# Patient Record
Sex: Female | Born: 1990 | Race: White | Hispanic: No | Marital: Single | Smoking: Current every day smoker
Health system: Southern US, Community
[De-identification: ages and names within clinical notes are randomized; demographics above are authoritative.]

## PROBLEM LIST (undated history)

## (undated) DIAGNOSIS — K219 Gastro-esophageal reflux disease without esophagitis: Secondary | ICD-10-CM

## (undated) DIAGNOSIS — F419 Anxiety disorder, unspecified: Secondary | ICD-10-CM

## (undated) DIAGNOSIS — E282 Polycystic ovarian syndrome: Secondary | ICD-10-CM

## (undated) DIAGNOSIS — D649 Anemia, unspecified: Secondary | ICD-10-CM

## (undated) DIAGNOSIS — E041 Nontoxic single thyroid nodule: Secondary | ICD-10-CM

## (undated) DIAGNOSIS — M2550 Pain in unspecified joint: Secondary | ICD-10-CM

## (undated) DIAGNOSIS — F32A Depression, unspecified: Secondary | ICD-10-CM

## (undated) DIAGNOSIS — K5900 Constipation, unspecified: Secondary | ICD-10-CM

## (undated) DIAGNOSIS — E739 Lactose intolerance, unspecified: Secondary | ICD-10-CM

## (undated) DIAGNOSIS — K589 Irritable bowel syndrome without diarrhea: Secondary | ICD-10-CM

## (undated) DIAGNOSIS — M549 Dorsalgia, unspecified: Secondary | ICD-10-CM

## (undated) HISTORY — DX: Anemia, unspecified: D64.9

## (undated) HISTORY — DX: Nontoxic single thyroid nodule: E04.1

## (undated) HISTORY — DX: Constipation, unspecified: K59.00

## (undated) HISTORY — DX: Lactose intolerance, unspecified: E73.9

## (undated) HISTORY — DX: Dorsalgia, unspecified: M54.9

## (undated) HISTORY — DX: Irritable bowel syndrome, unspecified: K58.9

## (undated) HISTORY — DX: Gastro-esophageal reflux disease without esophagitis: K21.9

## (undated) HISTORY — DX: Depression, unspecified: F32.A

## (undated) HISTORY — DX: Anxiety disorder, unspecified: F41.9

## (undated) HISTORY — DX: Pain in unspecified joint: M25.50

---
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
## 2008-06-01 ENCOUNTER — Emergency Department (HOSPITAL_BASED_OUTPATIENT_CLINIC_OR_DEPARTMENT_OTHER): Admission: EM | Admit: 2008-06-01 | Discharge: 2008-06-01 | Payer: Self-pay | Admitting: Emergency Medicine

## 2008-07-02 HISTORY — PX: TONSILLECTOMY AND ADENOIDECTOMY: SUR1326

## 2008-12-04 ENCOUNTER — Emergency Department (HOSPITAL_BASED_OUTPATIENT_CLINIC_OR_DEPARTMENT_OTHER): Admission: EM | Admit: 2008-12-04 | Discharge: 2008-12-04 | Payer: Self-pay | Admitting: Emergency Medicine

## 2008-12-04 ENCOUNTER — Ambulatory Visit: Payer: Self-pay | Admitting: Diagnostic Radiology

## 2009-04-18 ENCOUNTER — Emergency Department (HOSPITAL_COMMUNITY): Admission: EM | Admit: 2009-04-18 | Discharge: 2009-04-19 | Payer: Self-pay | Admitting: Emergency Medicine

## 2009-12-09 ENCOUNTER — Emergency Department (HOSPITAL_BASED_OUTPATIENT_CLINIC_OR_DEPARTMENT_OTHER): Admission: EM | Admit: 2009-12-09 | Discharge: 2009-12-09 | Payer: Self-pay | Admitting: Emergency Medicine

## 2010-10-09 LAB — RAPID STREP SCREEN (MED CTR MEBANE ONLY): Streptococcus, Group A Screen (Direct): NEGATIVE

## 2011-07-23 ENCOUNTER — Emergency Department (INDEPENDENT_AMBULATORY_CARE_PROVIDER_SITE_OTHER): Payer: 59

## 2011-07-23 ENCOUNTER — Encounter (HOSPITAL_BASED_OUTPATIENT_CLINIC_OR_DEPARTMENT_OTHER): Payer: Self-pay | Admitting: Family Medicine

## 2011-07-23 ENCOUNTER — Emergency Department (HOSPITAL_BASED_OUTPATIENT_CLINIC_OR_DEPARTMENT_OTHER)
Admission: EM | Admit: 2011-07-23 | Discharge: 2011-07-23 | Disposition: A | Discharge status: Home / Self Care | Payer: 59 | Attending: Emergency Medicine | Admitting: Emergency Medicine

## 2011-07-23 DIAGNOSIS — IMO0002 Reserved for concepts with insufficient information to code with codable children: Secondary | ICD-10-CM | POA: Insufficient documentation

## 2011-07-23 DIAGNOSIS — S161XXA Strain of muscle, fascia and tendon at neck level, initial encounter: Secondary | ICD-10-CM

## 2011-07-23 DIAGNOSIS — S0990XA Unspecified injury of head, initial encounter: Secondary | ICD-10-CM

## 2011-07-23 DIAGNOSIS — S0993XA Unspecified injury of face, initial encounter: Secondary | ICD-10-CM

## 2011-07-23 DIAGNOSIS — G43909 Migraine, unspecified, not intractable, without status migrainosus: Secondary | ICD-10-CM | POA: Insufficient documentation

## 2011-07-23 DIAGNOSIS — E282 Polycystic ovarian syndrome: Secondary | ICD-10-CM | POA: Insufficient documentation

## 2011-07-23 DIAGNOSIS — M542 Cervicalgia: Secondary | ICD-10-CM

## 2011-07-23 DIAGNOSIS — R51 Headache: Secondary | ICD-10-CM

## 2011-07-23 DIAGNOSIS — F0781 Postconcussional syndrome: Secondary | ICD-10-CM | POA: Insufficient documentation

## 2011-07-23 DIAGNOSIS — S199XXA Unspecified injury of neck, initial encounter: Secondary | ICD-10-CM

## 2011-07-23 DIAGNOSIS — IMO0001 Reserved for inherently not codable concepts without codable children: Secondary | ICD-10-CM | POA: Insufficient documentation

## 2011-07-23 DIAGNOSIS — G44309 Post-traumatic headache, unspecified, not intractable: Secondary | ICD-10-CM | POA: Insufficient documentation

## 2011-07-23 DIAGNOSIS — S139XXA Sprain of joints and ligaments of unspecified parts of neck, initial encounter: Secondary | ICD-10-CM | POA: Insufficient documentation

## 2011-07-23 HISTORY — DX: Polycystic ovarian syndrome: E28.2

## 2011-07-23 MED ORDER — KETOROLAC TROMETHAMINE 60 MG/2ML IM SOLN
60.0000 mg | Freq: Once | INTRAMUSCULAR | Status: AC
  Administered 2011-07-23: 60 mg via INTRAMUSCULAR
  Filled 2011-07-23: qty 2

## 2011-07-23 MED ORDER — HYDROCODONE-ACETAMINOPHEN 5-325 MG PO TABS
1.0000 | ORAL_TABLET | Freq: Once | ORAL | Status: DC
  Filled 2011-07-23: qty 1

## 2011-07-23 MED ORDER — METOCLOPRAMIDE HCL 5 MG/ML IJ SOLN
10.0000 mg | Freq: Once | INTRAMUSCULAR | Status: AC
  Administered 2011-07-23: 10 mg via INTRAMUSCULAR
  Filled 2011-07-23: qty 2

## 2011-07-23 MED ORDER — DIPHENHYDRAMINE HCL 50 MG/ML IJ SOLN
25.0000 mg | Freq: Once | INTRAMUSCULAR | Status: AC
  Administered 2011-07-23: 50 mg via INTRAMUSCULAR
  Filled 2011-07-23: qty 1

## 2011-07-23 MED ORDER — ONDANSETRON 4 MG PO TBDP
4.0000 mg | ORAL_TABLET | Freq: Once | ORAL | Status: AC
  Administered 2011-07-23: 4 mg via ORAL
  Filled 2011-07-23: qty 1

## 2011-07-23 MED ORDER — HYDROCODONE-ACETAMINOPHEN 5-500 MG PO TABS: 1.0000 | ORAL_TABLET | Freq: Four times a day (QID) | ORAL | Status: AC | PRN

## 2011-07-23 MED ORDER — ONDANSETRON 4 MG PO TBDP: 4.0000 mg | ORAL_TABLET | Freq: Three times a day (TID) | ORAL | Status: AC | PRN

## 2011-07-23 NOTE — ED Provider Notes (Signed)
History     CSN: [id number]  Arrival date & time 07/23/11  1839   First MD Initiated Contact with Patient 07/23/11 1903      Chief Complaint  Patient presents with  . Head Injury    (Consider location/radiation/quality/duration/timing/severity/associated sxs/prior treatment) HPI Comments: Pt states that she is having some blurry vision and is feeling tired:pt has a history of concussion about 8 months ago from previous fall off a horse:pt states that she didn't have a loc, but she did have a crack to the front of the helmet  Patient is a 21 y.o. female presenting with head injury. The history is provided by the patient. No language interpreter was used.  Head Injury  The incident occurred 3 to 5 hours ago. She came to the ER via walk-in. The injury mechanism was a direct blow. There was no loss of consciousness. There was no blood loss. The pain is moderate. The pain has been constant since the injury.    Past Medical History  Diagnosis Date  . Polycystic ovary disease   . Migraine     Past Surgical History  Procedure Date  . Tonsillectomy     No family history on file.  History  Substance Use Topics  . Smoking status: Never Smoker   . Smokeless tobacco: Not on file  . Alcohol Use: No    OB History    Grav Para Term Preterm Abortions TAB SAB Ect Mult Living                  Review of Systems  All other systems reviewed and are negative.    Allergies  Hepatitis b virus vaccine and Panmist dm  Home Medications   Current Outpatient Rx  Name Route Sig Dispense Refill  . NORGESTREL-ETHINYL ESTRADIOL 0.3-30 MG-MCG PO TABS Oral Take 1 tablet by mouth daily.      BP 148/92  Pulse 93  Temp(Src) 98.4 F (36.9 C) (Oral)  Resp 18  Ht 5\' 2"  (1.575 m)  Wt 160 lb (72.576 kg)  BMI 29.26 kg/m2  SpO2 99%  LMP 07/23/2011  Physical Exam  Nursing note and vitals reviewed. Constitutional: She appears well-developed and well-nourished.  HENT:  Head:  Normocephalic and atraumatic.  Nose: Nose normal.  Mouth/Throat: Oropharynx is clear and moist.  Eyes: Conjunctivae and EOM are normal. Pupils are equal, round, and reactive to light.  Neck: Neck supple.  Cardiovascular: Normal rate and regular rhythm.   Pulmonary/Chest: Effort normal and breath sounds normal.  Abdominal: Soft. Bowel sounds are normal.  Musculoskeletal:       Cervical back: She exhibits tenderness.  Skin: Skin is warm and dry.  Psychiatric: She has a normal mood and affect.    ED Course  Procedures (including critical care time)  Labs Reviewed - No data to display Ct Head Wo Contrast  07/23/2011  *RADIOLOGY REPORT*  Clinical Data: Head trauma secondary to a fall from horse. Headache and neck pain.  CT HEAD WITHOUT CONTRAST  Technique:  Contiguous axial images were obtained from the base of the skull through the vertex without contrast.  Comparison: 04/19/2009  Findings: There is no acute intracranial hemorrhage, infarction, or mass lesion.  Brain parenchyma is normal.  Osseous structures are normal.  IMPRESSION: Normal exam.  Original Report Authenticated By: Gwynn Burly, M.D.   Ct Cervical Spine Wo Contrast  07/23/2011  *RADIOLOGY REPORT*  Clinical Data: The head and neck trauma secondary to a fall from a horse.  Headache and posterior neck pain.  CT CERVICAL SPINE WITHOUT CONTRAST  Technique:  Multidetector CT imaging of the cervical spine was performed. Multiplanar CT image reconstructions were also generated.  Comparison: 04/19/2009  Findings: There is no fracture, subluxation, disc space narrowing, prevertebral soft tissue swelling, or other abnormality.  IMPRESSION: Normal exam.  Original Report Authenticated By: Gwynn Burly, M.D.     1. Post concussive syndrome   2. Cervical strain       MDM  Discussed restrictions with pt and discussed danger of multiple concussions 8:50 PM Pt has started vomiting and states that the headache is really bad and she  is need something for pain 9:52 PM Pt is feeling better at this time      Teressa Lower, NP 07/23/11 2008  Teressa Lower, NP 07/23/11 2051  Teressa Lower, NP 07/23/11 2152

## 2011-07-23 NOTE — ED Notes (Signed)
Pt alert and oriented. Ambulatory without difficulty. Denies pain or nausea at this time. In agreement to be dc'd home.

## 2011-07-23 NOTE — ED Provider Notes (Signed)
Medical screening examination/treatment/procedure(s) were performed by non-physician practitioner and as supervising physician I was immediately available for consultation/collaboration.   Kresha Abelson, MD 07/23/11 2321 

## 2011-07-23 NOTE — ED Notes (Signed)
Pt sts she fell off her horse at 4pm today hitting head and cracking her helmet. Pt c/o feeling tired and vision "blurry". Pt reports h/o concussion in April.

## 2011-07-23 NOTE — ED Notes (Signed)
Pt continues to c/o severe nausea. States the HA is worsening. NP made aware and new orders received.

## 2011-07-23 NOTE — ED Notes (Signed)
Upon entering room, pt noted to be vomiting into the trashcan. Pt states "i think i've vomited all that i can". Pt given ice chips per request, and saltine crackers. Will continue to monitor.

## 2011-07-23 NOTE — ED Notes (Signed)
Pt reports falling off her horse (while in a cantor) approx 3.5 hours ago. Reports cracked helmet. Pt was immediately ambulatory, and got back onto her horse after the incident. Denies neck or back pain, but does c/o headache with dizziness, which worried her. Mild blurry vision. Denies meds PTA. No sensory deficits. PERRL

## 2011-07-23 NOTE — ED Notes (Signed)
Pt reports pain/nausea are completely relieved. Lights turned back on to see if it triggers another HA. Then will reassess

## 2011-07-23 NOTE — ED Notes (Signed)
Pt c/o worsening nausea. States it worsened after turning the lights on. Pt given zofran ODT and lights dimmed for comfort. Will reassess shortly. C-Collar was also removed earlier by Teressa Lower NP. No change in neuro status.

## 2012-07-02 HISTORY — PX: BUNIONECTOMY: SHX129

## 2012-12-21 ENCOUNTER — Emergency Department (HOSPITAL_BASED_OUTPATIENT_CLINIC_OR_DEPARTMENT_OTHER): Payer: 59

## 2012-12-21 ENCOUNTER — Emergency Department (HOSPITAL_BASED_OUTPATIENT_CLINIC_OR_DEPARTMENT_OTHER)
Admission: EM | Admit: 2012-12-21 | Discharge: 2012-12-21 | Disposition: A | Discharge status: Home / Self Care | Payer: 59 | Attending: Emergency Medicine | Admitting: Emergency Medicine

## 2012-12-21 ENCOUNTER — Encounter (HOSPITAL_BASED_OUTPATIENT_CLINIC_OR_DEPARTMENT_OTHER): Payer: Self-pay | Admitting: *Deleted

## 2012-12-21 DIAGNOSIS — IMO0001 Reserved for inherently not codable concepts without codable children: Secondary | ICD-10-CM | POA: Insufficient documentation

## 2012-12-21 DIAGNOSIS — Y9389 Activity, other specified: Secondary | ICD-10-CM | POA: Insufficient documentation

## 2012-12-21 DIAGNOSIS — Z862 Personal history of diseases of the blood and blood-forming organs and certain disorders involving the immune mechanism: Secondary | ICD-10-CM | POA: Insufficient documentation

## 2012-12-21 DIAGNOSIS — Y929 Unspecified place or not applicable: Secondary | ICD-10-CM | POA: Insufficient documentation

## 2012-12-21 DIAGNOSIS — Z8679 Personal history of other diseases of the circulatory system: Secondary | ICD-10-CM | POA: Insufficient documentation

## 2012-12-21 DIAGNOSIS — Z79899 Other long term (current) drug therapy: Secondary | ICD-10-CM | POA: Insufficient documentation

## 2012-12-21 DIAGNOSIS — Z8639 Personal history of other endocrine, nutritional and metabolic disease: Secondary | ICD-10-CM | POA: Insufficient documentation

## 2012-12-21 DIAGNOSIS — S300XXA Contusion of lower back and pelvis, initial encounter: Secondary | ICD-10-CM | POA: Insufficient documentation

## 2012-12-21 MED ORDER — HYDROCODONE-ACETAMINOPHEN 5-325 MG PO TABS
1.0000 | ORAL_TABLET | Freq: Once | ORAL | Status: AC
  Administered 2012-12-21: 1 via ORAL
  Filled 2012-12-21: qty 1

## 2012-12-21 MED ORDER — HYDROCODONE-ACETAMINOPHEN 5-325 MG PO TABS: 1.0000 | ORAL_TABLET | ORAL | Status: DC | PRN

## 2012-12-21 NOTE — ED Notes (Addendum)
C/o falling off a horse today around 1pm. States she landed on her back. Denies loc. C/o pain to her left buttocks area and mild pain to her right buttocks area. Was wearing a helmet.

## 2012-12-21 NOTE — ED Provider Notes (Signed)
History    Scribed for Krista Cooper III, MD, the patient was seen in room MH12/MH12. This chart was scribed by Lewanda Rife, ED scribe. Patient's care was started at 2019    CSN: [id number]  Arrival date & time 12/21/12  4098   First MD Initiated Contact with Patient 12/21/12 2018      Chief Complaint  Patient presents with  . Fall    (Consider location/radiation/quality/duration/timing/severity/associated sxs/prior treatment) The history is provided by the patient.  HPI Comments: Krista Henson is a 22 y.o. female who presents to the Emergency Department complaining of falling off a horse onset 1 pm today. Reports she landed on her back and was wearing a helmet at the time. Reports associated non-radiating, constant left buttock pain. Describes buttock pain as moderate. Reports pain is aggravated by touch and alleviated at rest. Denies associated headaches, LOC, nausea, emesis, neck pain, back pain, abdominal pain, and paraesthesias. Denies taking any OTC medications to treat pain PTA.  Past Medical History  Diagnosis Date  . Polycystic ovary disease   . Migraine     Past Surgical History  Procedure Laterality Date  . Tonsillectomy      No family history on file.  History  Substance Use Topics  . Smoking status: Never Smoker   . Smokeless tobacco: Not on file  . Alcohol Use: No    OB History   Grav Para Term Preterm Abortions TAB SAB Ect Mult Living                  Review of Systems  Constitutional: Negative for fever.  HENT: Negative for neck pain.   Gastrointestinal: Negative for nausea, vomiting and abdominal pain.  Musculoskeletal: Positive for myalgias (left buttock pain ). Negative for back pain.  Skin: Negative for wound.  Neurological: Negative for numbness and headaches.  Psychiatric/Behavioral: Negative for confusion.  All other systems reviewed and are negative.  A complete 10 system review of systems was obtained and all systems are  negative except as noted in the HPI and PMH.     Allergies  Hepatitis b virus vaccine and Panmist dm  Home Medications   Current Outpatient Rx  Name  Route  Sig  Dispense  Refill  . norgestrel-ethinyl estradiol (CRYSELLE-28) 0.3-30 MG-MCG tablet   Oral   Take 1 tablet by mouth daily.           BP 130/64  Pulse 96  Temp(Src) 99 F (37.2 C) (Oral)  Resp 16  SpO2 95%  LMP 11/26/2012  Physical Exam  Nursing note and vitals reviewed. Constitutional: She is oriented to person, place, and time. She appears well-developed and well-nourished. No distress.  HENT:  Head: Normocephalic and atraumatic.  Eyes: Conjunctivae and EOM are normal.  Neck: Normal range of motion and full passive range of motion without pain. Neck supple. No spinous process tenderness and no muscular tenderness present. No tracheal deviation present.  Cardiovascular: Normal rate, regular rhythm and intact distal pulses.   Pulmonary/Chest: Effort normal and breath sounds normal. No respiratory distress. She has no wheezes. She has no rales. She exhibits no tenderness.  Abdominal: Soft. Bowel sounds are normal. She exhibits no distension. There is no tenderness. There is no rebound and no guarding.  Musculoskeletal: Normal range of motion. She exhibits tenderness.  Mildly tender over left sacral region on exam  Neurological: She is alert and oriented to person, place, and time.  Neurologically intact.  Skin: Skin is warm and dry.  Psychiatric:  She has a normal mood and affect. Her behavior is normal.    ED Course  Procedures (including critical care time) Medications  HYDROcodone-acetaminophen (NORCO/VICODIN) 5-325 MG per tablet 1 tablet (1 tablet Oral Given 12/21/12 2039)   8:31 PM Pt informed of negative imaging    Results for orders placed during the hospital encounter of 12/04/08  RAPID STREP SCREEN      Result Value Range   Streptococcus, Group A Screen (Direct) NEGATIVE  NEGATIVE   Dg Lumbar  Spine Complete  12/21/2012   *RADIOLOGY REPORT*  Clinical Data: Larey Seat from horse  LUMBAR SPINE - COMPLETE 4+ VIEW  Comparison:  None.  Findings:  There is no evidence of lumbar spine fracture. Alignment is normal.  Intervertebral disc spaces are maintained.  IMPRESSION: Negative.   Original Report Authenticated By: Janeece Riggers, M.D.   Dg Pelvis 1-2 Views  12/21/2012   *RADIOLOGY REPORT*  Clinical Data: Fall  PELVIS - 1-2 VIEW  Comparison:  None.  Findings:  There is no evidence of pelvic fracture or diastasis. No other pelvic bone lesions are seen.  IMPRESSION: Negative.   Original Report Authenticated By: Janeece Riggers, M.D.        1. Fall from horse, initial encounter   2. Contusion of sacrum, initial encounter     I personally performed the services described in this documentation, which was scribed in my presence. The recorded information has been reviewed and is accurate.  Osvaldo Human, M.D.     Krista Cooper III, MD 12/22/12 1213

## 2013-01-10 ENCOUNTER — Emergency Department (HOSPITAL_BASED_OUTPATIENT_CLINIC_OR_DEPARTMENT_OTHER)
Admission: EM | Admit: 2013-01-10 | Discharge: 2013-01-10 | Disposition: A | Discharge status: Home / Self Care | Payer: 59 | Attending: Emergency Medicine | Admitting: Emergency Medicine

## 2013-01-10 ENCOUNTER — Encounter (HOSPITAL_BASED_OUTPATIENT_CLINIC_OR_DEPARTMENT_OTHER): Payer: Self-pay | Admitting: *Deleted

## 2013-01-10 DIAGNOSIS — R110 Nausea: Secondary | ICD-10-CM | POA: Insufficient documentation

## 2013-01-10 DIAGNOSIS — G43109 Migraine with aura, not intractable, without status migrainosus: Secondary | ICD-10-CM | POA: Insufficient documentation

## 2013-01-10 DIAGNOSIS — H53149 Visual discomfort, unspecified: Secondary | ICD-10-CM | POA: Insufficient documentation

## 2013-01-10 DIAGNOSIS — Z8742 Personal history of other diseases of the female genital tract: Secondary | ICD-10-CM | POA: Insufficient documentation

## 2013-01-10 MED ORDER — KETOROLAC TROMETHAMINE 30 MG/ML IJ SOLN
30.0000 mg | Freq: Once | INTRAMUSCULAR | Status: AC
  Administered 2013-01-10: 30 mg via INTRAVENOUS
  Filled 2013-01-10: qty 1

## 2013-01-10 MED ORDER — METOCLOPRAMIDE HCL 5 MG/ML IJ SOLN
10.0000 mg | Freq: Once | INTRAMUSCULAR | Status: AC
  Administered 2013-01-10: 10 mg via INTRAVENOUS
  Filled 2013-01-10: qty 2

## 2013-01-10 MED ORDER — SODIUM CHLORIDE 0.9 % IV BOLUS (SEPSIS)
1000.0000 mL | Freq: Once | INTRAVENOUS | Status: AC
  Administered 2013-01-10: 1000 mL via INTRAVENOUS

## 2013-01-10 MED ORDER — DEXAMETHASONE SODIUM PHOSPHATE 10 MG/ML IJ SOLN
10.0000 mg | Freq: Once | INTRAMUSCULAR | Status: AC
  Administered 2013-01-10: 10 mg via INTRAVENOUS
  Filled 2013-01-10: qty 1

## 2013-01-10 MED ORDER — DIPHENHYDRAMINE HCL 50 MG/ML IJ SOLN
25.0000 mg | Freq: Once | INTRAMUSCULAR | Status: AC
  Administered 2013-01-10: 25 mg via INTRAVENOUS
  Filled 2013-01-10: qty 1

## 2013-01-10 MED ORDER — ONDANSETRON 8 MG PO TBDP
8.0000 mg | ORAL_TABLET | Freq: Once | ORAL | Status: AC
  Administered 2013-01-10: 8 mg via ORAL
  Filled 2013-01-10: qty 1

## 2013-01-10 NOTE — ED Notes (Addendum)
Pt has hx of migraines and has had this one since this a.m. +nausea. PERRL. Declined Zofran ODT.

## 2013-01-10 NOTE — ED Provider Notes (Signed)
History    CSN: [id number] Arrival date & time 01/10/13  1844  First MD Initiated Contact with Patient 01/10/13 1948     Chief Complaint  Patient presents with  . Migraine   (Consider location/radiation/quality/duration/timing/severity/associated sxs/prior Treatment) HPI Comments: Patient is a 22 year old female history of migraine headaches who presents today with a migraine. The migraine began this morning and she had an aura. The migraine is mostly in her frontal area, but she feels as though there is tension in her occipital region. Associated with nausea and photophobia. It began gradually. She used to take a headache cocktail, but states she can no longer afford it. She has not taken any medications PTA today. She is going to the chiropractor which has been helping her get less migraines. Currently the headache she is experiencing is similar to the migraines she normally gets. She had bunion surgery on Wednesday and is currently in a cast. No fever, chills, neck stiffness, rash, visual disturbances.  The history is provided by the patient. No language interpreter was used.   Past Medical History  Diagnosis Date  . Polycystic ovary disease   . Migraine    Past Surgical History  Procedure Laterality Date  . Tonsillectomy    . Foot surgery     History reviewed. No pertinent family history. History  Substance Use Topics  . Smoking status: Never Smoker   . Smokeless tobacco: Not on file  . Alcohol Use: No   OB History   Grav Para Term Preterm Abortions TAB SAB Ect Mult Living                 Review of Systems  Constitutional: Negative for fever and chills.  HENT: Negative for neck pain and neck stiffness.   Respiratory: Negative for shortness of breath.   Gastrointestinal: Positive for nausea. Negative for vomiting and abdominal pain.  Skin: Negative for rash.  All other systems reviewed and are negative.    Allergies  Hepatitis b virus vaccine and Panmist  dm  Home Medications   Current Outpatient Rx  Name  Route  Sig  Dispense  Refill  . oxyCODONE-acetaminophen (PERCOCET) 7.5-325 MG per tablet   Oral   Take 1 tablet by mouth every 4 (four) hours as needed for pain.         Marland Kitchen HYDROcodone-acetaminophen (NORCO/VICODIN) 5-325 MG per tablet   Oral   Take 1 tablet by mouth every 4 (four) hours as needed for pain.   20 tablet   0   . norgestrel-ethinyl estradiol (CRYSELLE-28) 0.3-30 MG-MCG tablet   Oral   Take 1 tablet by mouth daily.          BP 129/68  Pulse 80  Temp(Src) 99 F (37.2 C) (Oral)  Resp 16  Ht 5\' 2"  (1.575 m)  Wt 170 lb (77.111 kg)  BMI 31.09 kg/m2  SpO2 100%  LMP 12/29/2012 Physical Exam  Nursing note and vitals reviewed. Constitutional: She is oriented to person, place, and time. She appears well-developed and well-nourished. No distress.  HENT:  Head: Normocephalic and atraumatic.  Right Ear: External ear normal.  Left Ear: External ear normal.  Nose: Nose normal.  Mouth/Throat: Oropharynx is clear and moist.  No temporal artery tenderness  Eyes: Conjunctivae, EOM and lids are normal. Pupils are equal, round, and reactive to light.  Neck: Trachea normal, normal range of motion and phonation normal.  No nuchal rigidity or meningeal signs  Cardiovascular: Normal rate, regular rhythm, normal heart sounds,  intact distal pulses and normal pulses.   Pulmonary/Chest: Effort normal and breath sounds normal. No stridor. No respiratory distress. She has no wheezes. She has no rales.  Abdominal: Soft. She exhibits no distension.  Musculoskeletal: Normal range of motion.  Neurological: She is alert and oriented to person, place, and time. She has normal strength. No sensory deficit. Coordination and gait normal.  Skin: Skin is warm and dry. She is not diaphoretic. No erythema.  Psychiatric: She has a normal mood and affect. Her behavior is normal.    ED Course  Procedures (including critical care time) Labs  Reviewed - No data to display No results found. 1. Migraine with aura     MDM  Pt HA treated and improved while in ED.  Presentation is like pts typical HA and non concerning for [facility], ICH, Meningitis, or temporal arteritis. Pt is afebrile with no focal neuro deficits, nuchal rigidity, or change in vision. Pt is to follow up with PCP to discuss prophylactic medication. Pt verbalizes understanding and is agreeable with plan to dc.    Mora Bellman, PA-C 01/13/13 [phone]

## 2013-01-15 NOTE — ED Provider Notes (Signed)
Medical screening examination/treatment/procedure(s) were performed by non-physician practitioner and as supervising physician I was immediately available for consultation/collaboration.   Carleene Cooper III, MD 01/15/13 2036

## 2014-05-30 ENCOUNTER — Emergency Department (HOSPITAL_BASED_OUTPATIENT_CLINIC_OR_DEPARTMENT_OTHER)
Admission: EM | Admit: 2014-05-30 | Discharge: 2014-05-30 | Disposition: A | Discharge status: Home / Self Care | Payer: 59 | Attending: Emergency Medicine | Admitting: Emergency Medicine

## 2014-05-30 ENCOUNTER — Encounter (HOSPITAL_BASED_OUTPATIENT_CLINIC_OR_DEPARTMENT_OTHER): Payer: Self-pay | Admitting: Emergency Medicine

## 2014-05-30 DIAGNOSIS — L03119 Cellulitis of unspecified part of limb: Secondary | ICD-10-CM

## 2014-05-30 DIAGNOSIS — L02416 Cutaneous abscess of left lower limb: Secondary | ICD-10-CM | POA: Insufficient documentation

## 2014-05-30 DIAGNOSIS — Z8742 Personal history of other diseases of the female genital tract: Secondary | ICD-10-CM | POA: Insufficient documentation

## 2014-05-30 DIAGNOSIS — L02419 Cutaneous abscess of limb, unspecified: Secondary | ICD-10-CM

## 2014-05-30 DIAGNOSIS — Z8679 Personal history of other diseases of the circulatory system: Secondary | ICD-10-CM | POA: Diagnosis not present

## 2014-05-30 DIAGNOSIS — Z793 Long term (current) use of hormonal contraceptives: Secondary | ICD-10-CM | POA: Insufficient documentation

## 2014-05-30 MED ORDER — ONDANSETRON 4 MG PO TBDP
4.0000 mg | ORAL_TABLET | Freq: Once | ORAL | Status: AC
  Administered 2014-05-30: 4 mg via ORAL
  Filled 2014-05-30: qty 1

## 2014-05-30 MED ORDER — LIDOCAINE-EPINEPHRINE (PF) 2 %-1:200000 IJ SOLN
10.0000 mL | Freq: Once | INTRAMUSCULAR | Status: AC
  Administered 2014-05-30: 10 mL via INTRADERMAL

## 2014-05-30 MED ORDER — MORPHINE SULFATE 4 MG/ML IJ SOLN
4.0000 mg | Freq: Once | INTRAMUSCULAR | Status: AC
  Administered 2014-05-30: 4 mg via INTRAMUSCULAR
  Filled 2014-05-30: qty 1

## 2014-05-30 MED ORDER — HYDROCODONE-ACETAMINOPHEN 5-325 MG PO TABS: ORAL_TABLET | ORAL | Status: DC

## 2014-05-30 MED ORDER — LIDOCAINE-EPINEPHRINE (PF) 2 %-1:200000 IJ SOLN
INTRAMUSCULAR | Status: AC
  Administered 2014-05-30: 10 mL via INTRADERMAL
  Filled 2014-05-30: qty 20

## 2014-05-30 MED ORDER — SULFAMETHOXAZOLE-TRIMETHOPRIM 800-160 MG PO TABS
1.0000 | ORAL_TABLET | Freq: Once | ORAL | Status: AC
  Administered 2014-05-30: 1 via ORAL
  Filled 2014-05-30: qty 1

## 2014-05-30 MED ORDER — LIDOCAINE-EPINEPHRINE 2 %-1:100000 IJ SOLN: 20.0000 mL | Freq: Once | INTRAMUSCULAR | Status: DC

## 2014-05-30 MED ORDER — SULFAMETHOXAZOLE-TRIMETHOPRIM 800-160 MG PO TABS: 1.0000 | ORAL_TABLET | Freq: Two times a day (BID) | ORAL | Status: DC

## 2014-05-30 NOTE — ED Notes (Signed)
All bedside prep is done, patient ready for procedure.

## 2014-05-30 NOTE — ED Provider Notes (Signed)
CSN: [id number]     Arrival date & time 05/30/14  1842 History   First MD Initiated Contact with Patient 05/30/14 2045     Chief Complaint  Patient presents with  . Abscess     (Consider location/radiation/quality/duration/timing/severity/associated sxs/prior Treatment) HPI   Krista Henson is a 23 y.o. female complaining of pain, swelling and purulent discharge to posterior right calf after she thinks she may been bitten by a bug while on a cruise proximally 5 days ago. She denies fever, chills, endorses nausea on review of systems. Patient has her history of one abscess.   Past Medical History  Diagnosis Date  . Polycystic ovary disease   . Migraine    Past Surgical History  Procedure Laterality Date  . Tonsillectomy    . Foot surgery     No family history on file. History  Substance Use Topics  . Smoking status: Never Smoker   . Smokeless tobacco: Not on file  . Alcohol Use: No   OB History    No data available     Review of Systems  10 systems reviewed and found to be negative, except as noted in the HPI.   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 Authorizing Provider  HYDROcodone-acetaminophen (NORCO/VICODIN) 5-325 MG per tablet Take 1-2 tablets by mouth every 6 hours as needed for pain. 05/30/14   Laquilla Dault, PA-C  norgestrel-ethinyl estradiol (CRYSELLE-28) 0.3-30 MG-MCG tablet Take 1 tablet by mouth daily.    Historical Provider, MD  oxyCODONE-acetaminophen (PERCOCET) 7.5-325 MG per tablet Take 1 tablet by mouth every 4 (four) hours as needed for pain.    Historical Provider, MD  sulfamethoxazole-trimethoprim (SEPTRA DS) 800-160 MG per tablet Take 1 tablet by mouth 2 (two) times daily. 05/30/14   Lelia Jons, PA-C   BP 131/71 mmHg  Pulse 95  Temp(Src) 97.9 F (36.6 C) (Oral)  Resp 18  Ht 5\' 2"  (1.575 m)  Wt 180 lb (81.647 kg)  BMI  32.91 kg/m2  SpO2 99%  LMP 05/25/2014 Physical Exam  Constitutional: She is oriented to person, place, and time. She appears well-developed and well-nourished. No distress.  HENT:  Head: Normocephalic and atraumatic.  Mouth/Throat: Oropharynx is clear and moist.  Eyes: Conjunctivae and EOM are normal. Pupils are equal, round, and reactive to light.  Neck: Normal range of motion. Neck supple.  Cardiovascular: Normal rate, regular rhythm and intact distal pulses.   Pulmonary/Chest: Effort normal and breath sounds normal. No stridor.  Abdominal: Soft. Bowel sounds are normal. She exhibits no distension and no mass. There is no tenderness. There is no rebound and no guarding.  Musculoskeletal: Normal range of motion.       Legs: 12 cm area of cellulitis with central abscess with scab and scant purulent drainage.  Neurological: She is alert and oriented to person, place, and time.  Skin: Skin is warm.  Psychiatric: She has a normal mood and affect.  Nursing note and vitals reviewed.   ED Course  INCISION AND DRAINAGE Date/Time: 05/30/2014 11:33 PM Performed by: Wynetta EmeryPISCIOTTA, Dorlisa Savino Authorized by: Wynetta EmeryPISCIOTTA, Tawny Raspberry Consent: Verbal consent obtained. Consent given by: patient Patient identity confirmed: verbally with patient Type: abscess Anesthesia: local infiltration Local anesthetic: lidocaine 2% with epinephrine Anesthetic total: 3 ml Patient sedated: no Scalpel size: 11 Incision type: single straight Complexity: simple Drainage: bloody Drainage amount: scant Wound treatment: wound left open Packing  material: none Patient tolerance: Patient tolerated the procedure well with no immediate complications   (including critical care time) Labs Review Labs Reviewed - No data to display  Imaging Review No results found.   EKG Interpretation None      MDM   Final diagnoses:  Cellulitis and abscess of leg, except foot    Filed Vitals:   05/30/14 1906 05/30/14 2158  05/30/14 2222  BP: 151/95 133/84 131/71  Pulse: 106 79 95  Temp: 98.4 F (36.9 C) 97.9 F (36.6 C)   TempSrc:  Oral   Resp: 17 18 18   Height: 5\' 2"  (1.575 m)    Weight: 180 lb (81.647 kg)    SpO2: 99% 99% 99%    Medications  morphine 4 MG/ML injection 4 mg (4 mg Intramuscular Given 05/30/14 2123)  ondansetron (ZOFRAN-ODT) disintegrating tablet 4 mg (4 mg Oral Given 05/30/14 2123)  sulfamethoxazole-trimethoprim (BACTRIM DS,SEPTRA DS) 800-160 MG per tablet 1 tablet (1 tablet Oral Given 05/30/14 2122)  lidocaine-EPINEPHrine (XYLOCAINE W/EPI) 2 %-1:200000 (PF) injection 10 mL (10 mLs Intradermal Given 05/30/14 2231)    Krista Damshley Bartol is a 23 y.o. female presenting with abscess and cellulitis of right calf. Patient will be started on Bactrim, I and D performed. Patient is afebrile, well-appearing, no signs of systemic infection. Wound care and return precautions discussed.  Evaluation does not show pathology that would require ongoing emergent intervention or inpatient treatment. Pt is hemodynamically stable and mentating appropriately. Discussed findings and plan with patient/guardian, who agrees with care plan. All questions answered. Return precautions discussed and outpatient follow up given.   Discharge Medication List as of 05/30/2014 10:17 PM    START taking these medications   Details  sulfamethoxazole-trimethoprim (SEPTRA DS) 800-160 MG per tablet Take 1 tablet by mouth 2 (two) times daily., Starting 05/30/2014, Until Discontinued, Print             Wynetta Emeryicole Delson [place], PA-C 05/30/14 [id number]  Tilden FossaElizabeth Rees, MD 05/31/14 [phone]

## 2014-05-30 NOTE — Discharge Instructions (Signed)
If you see worsening signs of infection (warmth, redness, tenderness, pus, sharp increase in pain, fever, red streaking) immediately return to the emergency department.   Abscess Care After An abscess (also called a boil or furuncle) is an infected area that contains a collection of pus. Signs and symptoms of an abscess include pain, tenderness, redness, or hardness, or you may feel a moveable soft area under your skin. An abscess can occur anywhere in the body. The infection may spread to surrounding tissues causing cellulitis. A cut (incision) by the surgeon was made over your abscess and the pus was drained out. Gauze may have been packed into the space to provide a drain that will allow the cavity to heal from the inside outwards. The boil may be painful for 5 to 7 days. Most people with a boil do not have high fevers. Your abscess, if seen early, may not have localized, and may not have been lanced. If not, another appointment may be required for this if it does not get better on its own or with medications. HOME CARE INSTRUCTIONS   Only take over-the-counter or prescription medicines for pain, discomfort, or fever as directed by your caregiver.  When you bathe, soak and then remove gauze or iodoform packs at least daily or as directed by your caregiver. You may then wash the wound gently with mild soapy water. Repack with gauze or do as your caregiver directs. SEEK IMMEDIATE MEDICAL CARE IF:   You develop increased pain, swelling, redness, drainage, or bleeding in the wound site.  You develop signs of generalized infection including muscle aches, chills, fever, or a general ill feeling.  An oral temperature above 102 F (38.9 C) develops, not controlled by medication. See your caregiver for a recheck if you develop any of the symptoms described above. If medications (antibiotics) were prescribed, take them as directed. Document Released: 01/04/2005 Document Revised: 09/10/2011 Document  Reviewed: 09/01/2007 [facility] Patient Information 2015 [place], [place]. This information is not intended to replace advice given to you by your health care provider. Make sure you discuss any questions you have with your health care provider.  Cellulitis Cellulitis is an infection of the skin and the tissue beneath it. The infected area is usually red and tender. Cellulitis occurs most often in the arms and lower legs.  CAUSES  Cellulitis is caused by bacteria that enter the skin through cracks or cuts in the skin. The most common types of bacteria that cause cellulitis are staphylococci and streptococci. SIGNS AND SYMPTOMS   Redness and warmth.  Swelling.  Tenderness or pain.  Fever. DIAGNOSIS  Your health care provider can usually determine what is wrong based on a physical exam. Blood tests may also be done. TREATMENT  Treatment usually involves taking an antibiotic medicine. HOME CARE INSTRUCTIONS   Take your antibiotic medicine as directed by your health care provider. Finish the antibiotic even if you start to feel better.  Keep the infected arm or leg elevated to reduce swelling.  Apply a warm cloth to the affected area up to 4 times per day to relieve pain.  Take medicines only as directed by your health care provider.  Keep all follow-up visits as directed by your health care provider. SEEK MEDICAL CARE IF:   You notice red streaks coming from the infected area.  Your red area gets larger or turns dark in color.  Your bone or joint underneath the infected area becomes painful after the skin has healed.  Your infection  returns in the same area or another area.  You notice a swollen bump in the infected area.  You develop new symptoms.  You have a fever. SEEK IMMEDIATE MEDICAL CARE IF:   You feel very sleepy.  You develop vomiting or diarrhea.  You have a general ill feeling (malaise) with muscle aches and pains. MAKE SURE YOU:   Understand these  instructions.  Will watch your condition.  Will get help right away if you are not doing well or get worse. Document Released: 03/28/2005 Document Revised: 11/02/2013 Document Reviewed: 09/03/2011 [facility] Center[facility] Patient Information 2015 [place], [place]. This information is not intended to replace advice given to you by your health care provider. Make sure you discuss any questions you have with your health care provider.

## 2014-05-30 NOTE — ED Notes (Signed)
Pt presents to ED with complaints of abscess to left calf. Pt was on a cruise and and when she returned to the boat at [place] she felt a sting to her leg.

## 2014-12-07 IMAGING — CR DG LUMBAR SPINE COMPLETE 4+V
5 series · 5 of 5 positions shown · non-contrast
Comparison: None.

CLINICAL DATA: Fell from horse

LUMBAR SPINE - COMPLETE 4+ VIEW

[t l-spine a.p.]
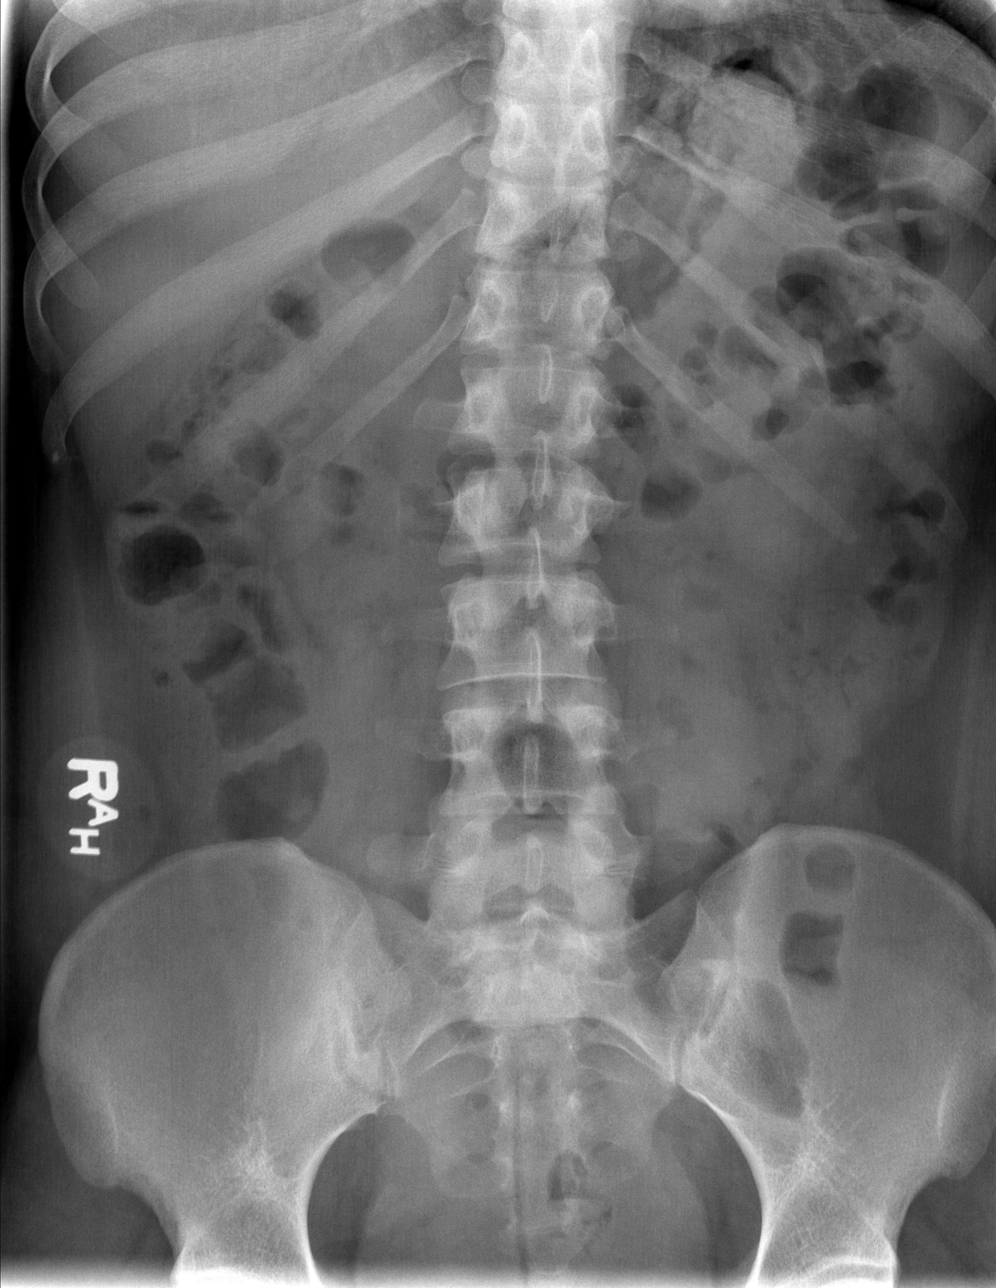

[t l-spine oblique exposure (1 of 2)]
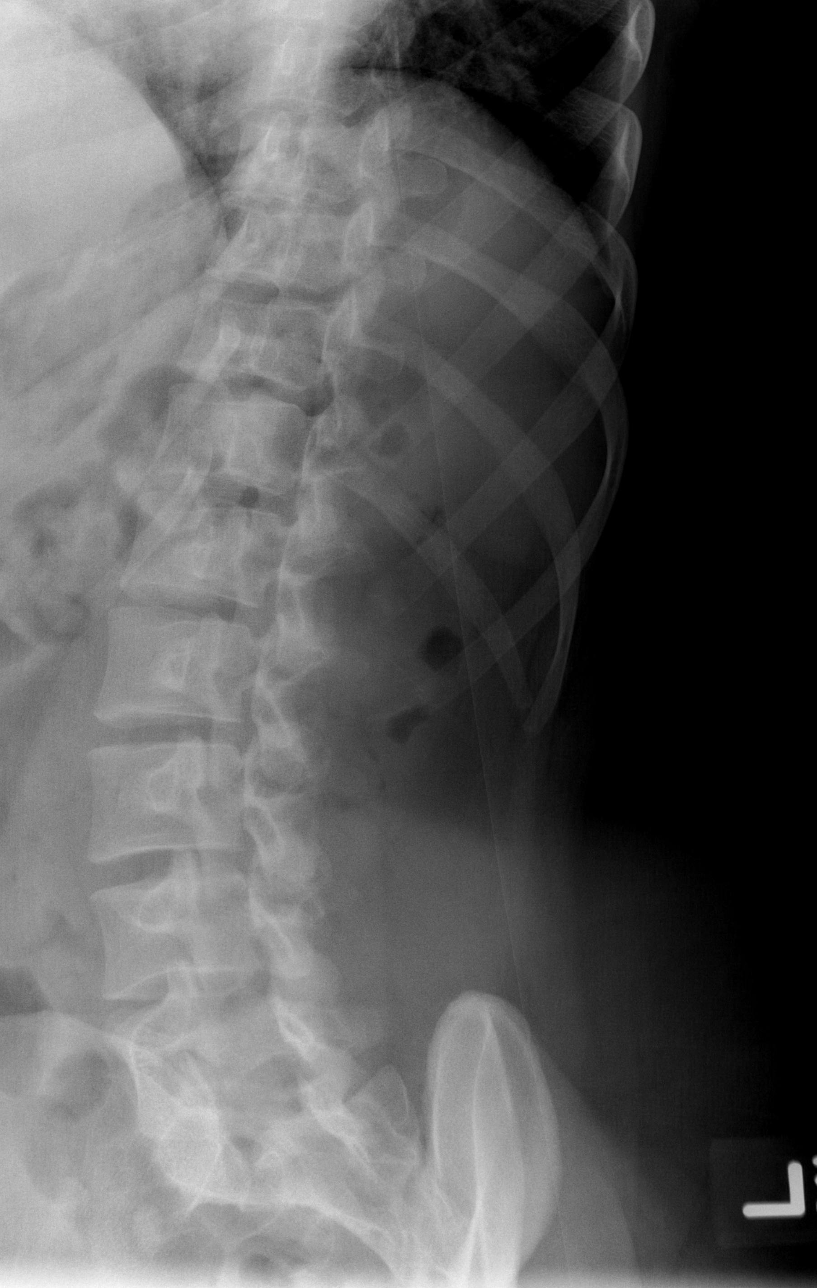

[t l-spine oblique exposure (2 of 2)]
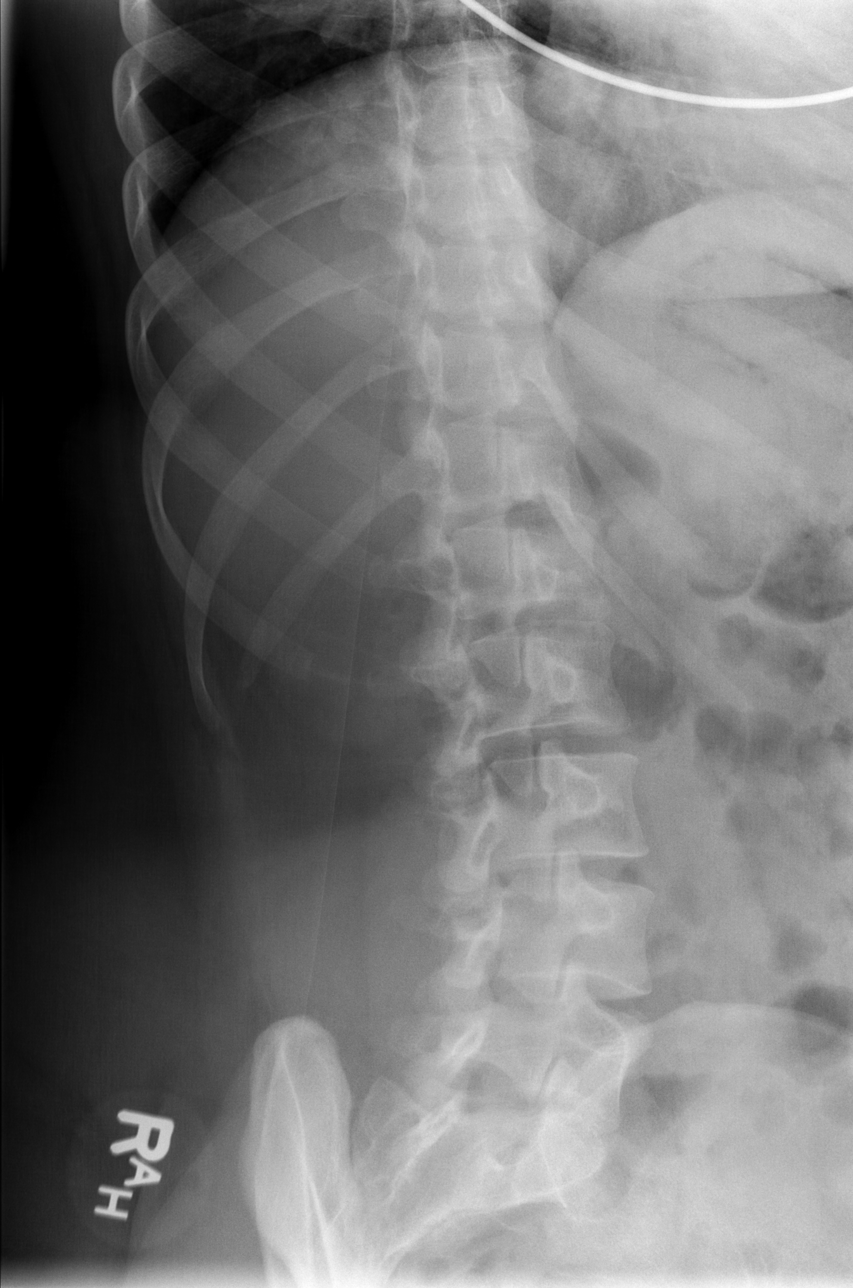

[t l-spine lat]
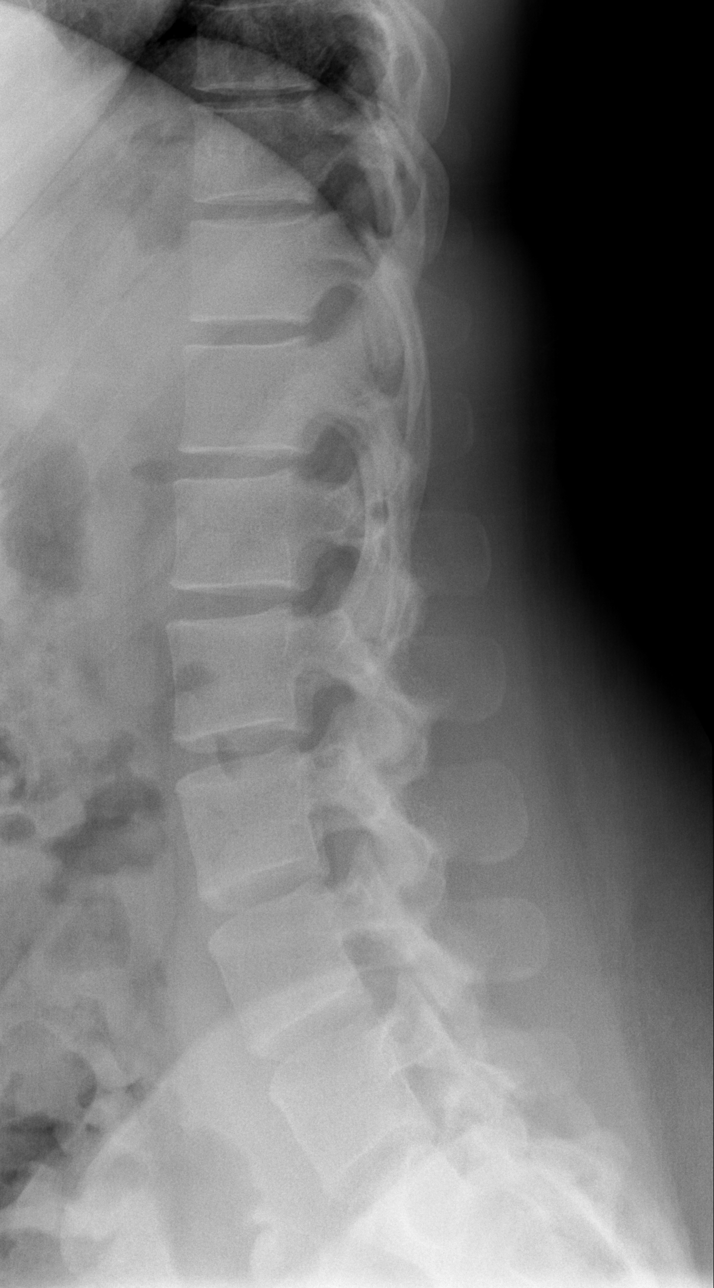

[t l-spine l5-s1 spot]
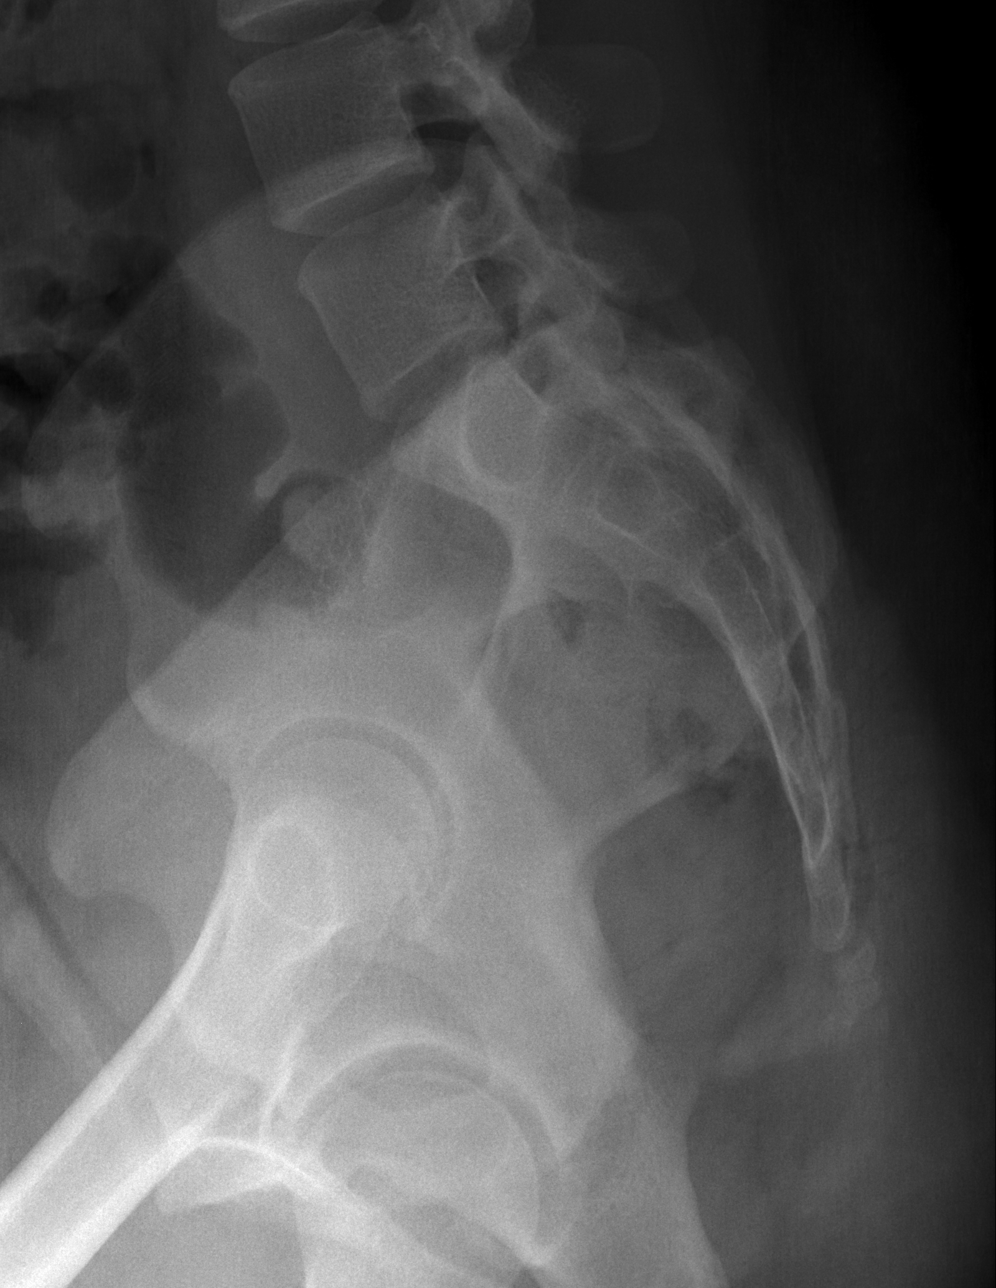

[5 of 5 positions shown; findings below may reference images not displayed]

FINDINGS: There is no evidence of lumbar spine fracture.
Alignment is normal.  Intervertebral disc spaces are maintained.
IMPRESSION: Negative.

## 2015-07-03 HISTORY — PX: UPPER GI ENDOSCOPY: SHX6162

## 2016-03-27 DIAGNOSIS — G43109 Migraine with aura, not intractable, without status migrainosus: Secondary | ICD-10-CM | POA: Insufficient documentation

## 2017-01-07 ENCOUNTER — Ambulatory Visit (INDEPENDENT_AMBULATORY_CARE_PROVIDER_SITE_OTHER): Payer: 59 | Admitting: Neurology

## 2017-01-07 ENCOUNTER — Encounter: Payer: Self-pay | Admitting: Neurology

## 2017-01-07 VITALS — BP 124/84 | HR 64 | Ht 62.0 in

## 2017-01-07 DIAGNOSIS — G43709 Chronic migraine without aura, not intractable, without status migrainosus: Secondary | ICD-10-CM

## 2017-01-07 MED ORDER — DICLOFENAC POTASSIUM(MIGRAINE) 50 MG PO PACK: PACK | ORAL | 11 refills | Status: DC

## 2017-01-07 MED ORDER — CYCLOBENZAPRINE HCL 10 MG PO TABS: 10.0000 mg | ORAL_TABLET | Freq: Three times a day (TID) | ORAL | 3 refills | Status: DC | PRN

## 2017-01-07 NOTE — Patient Instructions (Signed)
Remember to drink plenty of fluid, eat healthy meals and do not skip any meals. Try to eat protein with a every meal and eat a healthy snack such as fruit or nuts in between meals. Try to keep a regular sleep-wake schedule and try to exercise daily, particularly in the form of walking, 20-30 minutes a day, if you can.   As far as your medications are concerned, I would like to suggest:  Continue Atenolol At onset of headache take zomig, zofran and cambia/zipsor.   Cambia 50mg  daily Zipsor 25mg  3-4x a day as needed Max 100mg  a day for Diclofenac  [facility] - will order Cambia - will order Botox, will request   I would like to see you back in for botox, sooner if we need to. Please call us with any interim questions, concerns, problems, updates or refill requests.   Our phone number is [phone]. We also have an after hours call service for urgent matters and there is a physician on-call for urgent questions. For any emergencies you know to call 911 or go to the nearest emergency room  Cyclobenzaprine tablets What is this medicine? CYCLOBENZAPRINE (sye kloe BEN za preen) is a muscle relaxer. It is used to treat muscle pain, spasms, and stiffness. This medicine may be used for other purposes; ask your health care provider or pharmacist if you have questions. COMMON BRAND NAME(S): Fexmid, Flexeril What should I tell my health care provider before I take this medicine? They need to know if you have any of these conditions: -heart disease, irregular heartbeat, or previous heart attack -liver disease -thyroid problem -an unusual or allergic reaction to cyclobenzaprine, tricyclic antidepressants, lactose, other medicines, foods, dyes, or preservatives -pregnant or trying to get pregnant -breast-feeding How should I use this medicine? Take this medicine by mouth with a glass of water. Follow the directions on the prescription label. If this medicine upsets your stomach, take it with food or  milk. Take your medicine at regular intervals. Do not take it more often than directed. Talk to your pediatrician regarding the use of this medicine in children. Special care may be needed. Overdosage: If you think you have taken too much of this medicine contact a poison control center or emergency room at once. NOTE: This medicine is only for you. Do not share this medicine with others. What if I miss a dose? If you miss a dose, take it as soon as you can. If it is almost time for your next dose, take only that dose. Do not take double or extra doses. What may interact with this medicine? Do not take this medicine with any of the following medications: -certain medicines for fungal infections like fluconazole, itraconazole, ketoconazole, posaconazole, voriconazole -cisapride -dofetilide -dronedarone -halofantrine -levomethadyl -MAOIs like Carbex, Eldepryl, Marplan, Nardil, and Parnate -narcotic medicines for cough -pimozide -thioridazine -ziprasidone This medicine may also interact with the following medications: -alcohol -antihistamines for allergy, cough and cold -certain medicines for anxiety or sleep -certain medicines for cancer -certain medicines for depression like amitriptyline, fluoxetine, sertraline -certain medicines for infection like alfuzosin, chloroquine, clarithromycin, levofloxacin, mefloquine, pentamidine, troleandomycin -certain medicines for irregular heart beat -certain medicines for seizures like phenobarbital, primidone -contrast dyes -general anesthetics like halothane, isoflurane, methoxyflurane, propofol -local anesthetics like lidocaine, pramoxine, tetracaine -medicines that relax muscles for surgery -narcotic medicines for pain -other medicines that prolong the QT interval (cause an abnormal heart rhythm) -phenothiazines like chlorpromazine, mesoridazine, prochlorperazine This list may not describe all possible interactions. Give your health  care  provider a list of all the medicines, herbs, non-prescription drugs, or dietary supplements you use. Also tell them if you smoke, drink alcohol, or use illegal drugs. Some items may interact with your medicine. What should I watch for while using this medicine? Tell your doctor or health care professional if your symptoms do not start to get better or if they get worse. You may get drowsy or dizzy. Do not drive, use machinery, or do anything that needs mental alertness until you know how this medicine affects you. Do not stand or sit up quickly, especially if you are an older patient. This reduces the risk of dizzy or fainting spells. Alcohol may interfere with the effect of this medicine. Avoid alcoholic drinks. If you are taking another medicine that also causes drowsiness, you may have more side effects. Give your health care provider a list of all medicines you use. Your doctor will tell you how much medicine to take. Do not take more medicine than directed. Call emergency for help if you have problems breathing or unusual sleepiness. Your mouth may get dry. Chewing sugarless gum or sucking hard candy, and drinking plenty of water may help. Contact your doctor if the problem does not go away or is severe. What side effects may I notice from receiving this medicine? Side effects that you should report to your doctor or health care professional as soon as possible: -allergic reactions like skin rash, itching or hives, swelling of the face, lips, or tongue -breathing problems -chest pain -fast, irregular heartbeat -hallucinations -seizures -unusually weak or tired Side effects that usually do not require medical attention (report to your doctor or health care professional if they continue or are bothersome): -headache -nausea, vomiting This list may not describe all possible side effects. Call your doctor for medical advice about side effects. You may report side effects to FDA at  1-800-FDA-1088. Where should I keep my medicine? Keep out of the reach of children. Store at room temperature between 15 and 30 degrees C (59 and 86 degrees F). Keep container tightly closed. Throw away any unused medicine after the expiration date. NOTE: This sheet is a summary. It may not cover all possible information. If you have questions about this medicine, talk to your doctor, pharmacist, or health care provider.  2018 [employer] Standard (2015-03-29 12:05:46)   Diclofenac immediate-release capsules What is this medicine? DICLOFENAC (dye KLOE fen ak) is a non-steroidal anti-inflammatory drug (NSAID). It is used to reduce swelling and to treat pain. It may be used to treat osteoarthritis, rheumatoid arthritis, mild to moderate pain, and painful monthly periods. This medicine may be used for other purposes; ask your health care provider or pharmacist if you have questions. COMMON BRAND NAME(S): Zipsor, Zorvolex What should I tell my health care provider before I take this medicine? They need to know if you have any of these conditions: -asthma, especially aspirin sensitive asthma -coronary artery bypass graft (CABG) surgery within the past 2 weeks -drink more than 3 alcohol containing drinks a day -heart disease or circulation problems like heart failure or leg edema (fluid retention) -high blood pressure -kidney disease -liver disease -stomach bleeding or ulcers -an unusual or allergic reaction to diclofenac, aspirin, other NSAIDs, bovine or cow proteins, other medicines, foods, dyes, or preservatives -pregnant or trying to get pregnant -breast-feeding How should I use this medicine? Take this medicine by mouth with a full glass of water. Follow the directions on the prescription label. Take your medicine at  regular intervals. Do not take your medicine more often than directed. Long-term, continuous use may increase the risk of heart attack or stroke. A special MedGuide will be  given to you by the pharmacist with each prescription and refill. Be sure to read this information carefully each time. Talk to your pediatrician regarding the use of this medicine in children. Special care may be needed. Elderly patients over 65 years old may have a stronger reaction and need a smaller dose. Overdosage: If you think you have taken too much of this medicine contact a poison control center or emergency room at once. NOTE: This medicine is only for you. Do not share this medicine with others. What if I miss a dose? If you miss a dose, take it as soon as you can. If it is almost time for your next dose, take only that dose. Do not take double or extra doses. What may interact with this medicine? Do not take this medicine with any of the following medications: -cidofovir -ketorolac -methotrexate This medicine may also interact with the following medications: -alcohol -aspirin and aspirin-like medicines -certain medicines for blood pressure -certain medicines for depression, like fluoxetine, sertraline -certain medicines that treat or prevent blood clots like warfarin, enoxaparin, dalteparin, apixaban, dabigatran, and rivaroxaban -cyclosporine -digoxin -diuretics -lithium -medicines for osteoporosis -NSAIDs, medicines for pain and inflammation, like ibuprofen or naproxen -pemetrexed -rifampin -steroid medicines like prednisone or cortisone -voriconazole This list may not describe all possible interactions. Give your health care provider a list of all the medicines, herbs, non-prescription drugs, or dietary supplements you use. Also tell them if you smoke, drink alcohol, or use illegal drugs. Some items may interact with your medicine. What should I watch for while using this medicine? Tell your doctor or health care professional if your pain does not get better. Talk to your doctor before taking another medicine for pain. Do not treat yourself. This medicine does not  prevent heart attack or stroke. In fact, this medicine may increase the chance of a heart attack or stroke. The chance may increase with longer use of this medicine and in people who have heart disease. If you take aspirin to prevent heart attack or stroke, talk with your doctor or health care professional. Do not take medicines such as ibuprofen and naproxen with this medicine. Side effects such as stomach upset, nausea, or ulcers may be more likely to occur. Many medicines available without a prescription should not be taken with this medicine. This medicine can cause ulcers and bleeding in the stomach and intestines at any time during treatment. Do not smoke cigarettes or drink alcohol. These increase irritation to your stomach and can make it more susceptible to damage from this medicine. Ulcers and bleeding can happen without warning symptoms and can cause death. You may get drowsy or dizzy. Do not drive, use machinery, or do anything that needs mental alertness until you know how this medicine affects you. Do not stand or sit up quickly, especially if you are an older patient. This reduces the risk of dizzy or fainting spells. This medicine can cause you to bleed more easily. Try to avoid damage to your teeth and gums when you brush or floss your teeth. In females, this medicine may temporarily make it more difficult to get pregnant. Talk with your doctor or health care professional if you are concerned about your fertility. What side effects may I notice from receiving this medicine? Side effects that you should report to your doctor  or health care professional as soon as possible: -allergic reactions like skin rash, itching or hives, swelling of the face, lips, or tongue -chest pain or chest tightness -difficulty breathing or wheezing -redness, blistering, peeling or loosening of the skin, including inside the mouth -signs and symptoms of bleeding such as bloody or black, tarry stools; red or  dark brown urine; spitting up blood or brown material that looks like coffee grounds; red spots on the skin; unusual bruising or bleeding from the eye, gums, or nose -signs and symptoms of liver injury like dark yellow or brown urine; general ill feeling or flu-like symptoms; light-colored stools; loss of appetite; nausea; right upper belly pain; unusually weak or tired; yellowing of the eyes or skin -signs and symptoms of kidney injury like trouble passing urine or change in the amount of urine -signs and symptoms of a stroke like changes in vision; confusion; trouble speaking or understanding; severe headaches; sudden numbness or weakness of the face, arm or leg; trouble walking; dizziness; loss of balance or coordination -unexplained weight gain or swelling Side effects that usually do not require medical attention (report to your doctor or health care professional if they continue or are bothersome): -constipation -diarrhea -dizziness -headache -heartburn This list may not describe all possible side effects. Call your doctor for medical advice about side effects. You may report side effects to FDA at 1-800-FDA-1088. Where should I keep my medicine? Keep out of the reach of children. Store at room temperature between 15 and 30 degrees C (59 and 86 degrees F). Protect from moisture. Throw away any unused medicine after the expiration date. NOTE: This sheet is a summary. It may not cover all possible information. If you have questions about this medicine, talk to your doctor, pharmacist, or health care provider.  2018 [employer] Standard (2015-07-21 12:47:39)   Diclofenac powder for oral solution What is this medicine? DICLOFENAC (dye KLOE fen ak) is a non-steroidal anti-inflammatory drug (NSAID). It is used to treat migraine pain. This medicine may be used for other purposes; ask your health care provider or pharmacist if you have questions. COMMON BRAND NAME(S): Cambia What should I  tell my health care provider before I take this medicine? They need to know if you have any of these conditions: -asthma, especially aspirin sensitive asthma -coronary artery bypass graft (CABG) surgery within the past 2 weeks -drink more than 3 alcohol-containing drinks a day -heart disease or circulation problems like heart failure or leg edema (fluid retention) -high blood pressure -kidney disease -liver disease -phenylketonuria -stomach problems -an unusual or allergic reaction to diclofenac, aspirin, other NSAIDs, other medicines, foods, dyes, or preservatives -pregnant or trying to get pregnant -breast-feeding How should I use this medicine? Mix this medicine with 1 to 2 ounces of water. Drink the medicine and water together. Follow the directions on the prescription label. Do not take your medicine more often than directed. Long-term, continuous use may increase the risk of heart attack or stroke. A special MedGuide will be given to you by the pharmacist with each prescription and refill. Be sure to read this information carefully each time. Talk to your pediatrician regarding the use of this medicine in children. Special care may be needed. Elderly patients over 24 years old may have a stronger reaction and need a smaller dose. Overdosage: If you think you have taken too much of this medicine contact a poison control center or emergency room at once. NOTE: This medicine is only for you. Do not share  this medicine with others. What if I miss a dose? This does not apply. What may interact with this medicine? Do not take this medicine with any of the following medications: -cidofovir -ketorolac -methotrexate This medicine may also interact with the following medications: -alcohol -aspirin and aspirin-like medicines -cyclosporine -diuretics -lithium -medicines for blood pressure -medicines for osteoporosis -medicines that affect platelets -medicines that treat or prevent  blood clots like warfarin -NSAIDs, medicines for pain and inflammation, like ibuprofen or naproxen -pemetrexed -steroid medicines like prednisone or cortisone This list may not describe all possible interactions. Give your health care provider a list of all the medicines, herbs, non-prescription drugs, or dietary supplements you use. Also tell them if you smoke, drink alcohol, or use illegal drugs. Some items may interact with your medicine. What should I watch for while using this medicine? Tell your doctor or health care professional if your pain does not get better. Talk to your doctor before taking another medicine for pain. Do not treat yourself. This medicine does not prevent heart attack or stroke. In fact, this medicine may increase the chance of a heart attack or stroke. The chance may increase with longer use of this medicine and in people who have heart disease. If you take aspirin to prevent heart attack or stroke, talk with your doctor or health care professional. Do not take medicines such as ibuprofen and naproxen with this medicine. Side effects such as stomach upset, nausea, or ulcers may be more likely to occur. Many medicines available without a prescription should not be taken with this medicine. This medicine can cause ulcers and bleeding in the stomach and intestines at any time during treatment. Do not smoke cigarettes or drink alcohol. These increase irritation to your stomach and can make it more susceptible to damage from this medicine. Ulcers and bleeding can happen without warning symptoms and can cause death. You may get drowsy or dizzy. Do not drive, use machinery, or do anything that needs mental alertness until you know how this medicine affects you. Do not stand or sit up quickly, especially if you are an older patient. This reduces the risk of dizzy or fainting spells. This medicine can cause you to bleed more easily. Try to avoid damage to your teeth and gums when you  brush or floss your teeth. If you take migraine medicines for 10 or more days a month, your migraines may get worse. Keep a diary of headache days and medicine use. Contact your healthcare professional if your migraine attacks occur more frequently. What side effects may I notice from receiving this medicine? Side effects that you should report to your doctor or health care professional as soon as possible: -allergic reactions like skin rash, itching or hives, swelling of the face, lips, or tongue -black or bloody stools, blood in the urine or vomit -blurred vision -chest pain -difficulty breathing or wheezing -nausea or vomiting -fever -redness, blistering, peeling or loosening of the skin, including inside the mouth -slurred speech or weakness on one side of the body -trouble passing urine or change in the amount of urine -unexplained weight gain or swelling -unusually weak or tired -yellowing of eyes or skin Side effects that usually do not require medical attention (report to your doctor or health care professional if they continue or are bothersome): -constipation -diarrhea -dizziness -headache -heartburn This list may not describe all possible side effects. Call your doctor for medical advice about side effects. You may report side effects to FDA at 1-800-FDA-1088.  Where should I keep my medicine? Keep out of the reach of children. Store at room temperature between 15 and 30 degrees C (59 and 86 degrees F). Throw away any unused medicine after the expiration date. NOTE: This sheet is a summary. It may not cover all possible information. If you have questions about this medicine, talk to your doctor, pharmacist, or health care provider.  2018 [employer] Standard (2015-07-21 09:56:49)

## 2017-01-07 NOTE — Progress Notes (Signed)
[facility]    Provider:  Dr Lucia Gaskins Referring Provider: [name] Glimpse, * Primary Care Physician:  [name] Glimpse, PA-C  CC:  Migraines  HPI:  Krista Henson is a 26 y.o. female here as a referral from Dr. Doylene Canning for migraines. She's had migraines for decades and failed multiple medications. She's tried everything. She has kept headache diaries, he has completed diet changes, tried tumeric and Riboflavin, magnesium, Coq-10, fish oil. She has failed multiple medications. She has been to multiple neurologists and physicians. Migraines start at the front of her head and progress to the back. A mouthgard helped with morning headaches. Usually migraines are in the afternoon. She has a headache daily, 15 days ar migrainous and one severe migraine a week that is 10/10 pain. Migraines can last up to 3 days. No aura. No medication obveruse. Feels like someone is pounding her and is unilateral, throbbing, with nausea, vomiting. Can last up to 24 hours. She has a family history of migraines. Mom with migraines. Migraines since middle school. Daily headaches for years. Patient has had daily headaches with over 15 migraines monthly for years. Dark rooms help, ice packs. No other focal neurologic deficits, associated symptoms, inciting events or modifiable factors.  Medications: Topiramate, Nortriptyline, Imipramine, Propranolol, Atenolol, maxalt, naratriptan, flexeril, Cambia, Imitrex made it worse,   Reviewed notes, labs and imaging from outside physicians, which showed:    Ct showed No acute intracranial abnormalities including mass lesion or mass effect, hydrocephalus, extra-axial fluid collection, midline shift, hemorrhage, or acute infarction, large ischemic events (personally reviewed images)  Reviewed labs TSH 1.075, T4 total, T3 uptake and free thyroxine index normal.  Review of Systems: Patient complains of symptoms per HPI as well as the following symptoms: no  CP, no SOB. Pertinent negatives and positives per HPI. All others negative.   Social History   Social History  . Marital status: Single    Spouse name: N/A  . Number of children: 0  . Years of education: BS   Occupational History  . [employer] [place]    Social History Main Topics  . Smoking status: Current Every Day Smoker    Packs/day: 0.50  . Smokeless tobacco: Never Used  . Alcohol use 3.6 oz/week    6 Standard drinks or equivalent per week     Comment: 3x/wk  . Drug use: No  . Sexual activity: No   Other Topics Concern  . Not on file   Social History Narrative   Lives alone   Caffeine: 2 per day    Family History  Problem Relation Age of Onset  . Hyperthyroidism Mother   . Migraines Mother   . Hyperthyroidism Maternal Grandmother   . Diabetes Maternal Grandfather   . Stroke Paternal Grandmother   . Pancreatic cancer Paternal Grandfather     Past Medical History:  Diagnosis Date  . Anxiety   . GERD (gastroesophageal reflux disease)   . Migraine   . Polycystic ovary disease     Past Surgical History:  Procedure Laterality Date  . BUNIONECTOMY Left 2014  . TONSILLECTOMY AND ADENOIDECTOMY  2010  . UPPER GI ENDOSCOPY  2017  . WISDOM TOOTH EXTRACTION  2005    Current Outpatient Prescriptions  Medication Sig Dispense Refill  . atenolol (TENORMIN) 25 MG tablet Take by mouth daily.     Marland Kitchen BIOTIN PO Take by mouth daily.    . cyclobenzaprine (FLEXERIL) 10 MG tablet Take 1 tablet (10 mg total) by mouth  3 (three) times daily as needed for muscle spasms. 90 tablet 3  . escitalopram (LEXAPRO) 10 MG tablet Take 10 mg by mouth.    . esomeprazole (NEXIUM) 40 MG capsule Take 40 mg by mouth daily.    . Levonorgestrel (KYLEENA) 19.5 MG IUD by Intrauterine route.    . Methylcobalamin (METHYL B-12 PO) Take by mouth daily.    . naproxen (NAPROSYN) 500 MG tablet Take 500 mg by mouth as needed.     . ondansetron (ZOFRAN) 8 MG tablet Take by mouth.    . Probiotic  Product (PROBIOTIC PO) Take by mouth daily.    Marland Kitchen. zolmitriptan (ZOMIG) 5 MG nasal solution Place into the nose.    . Diclofenac Potassium (CAMBIA) 50 MG PACK Take 1 packet (50 mg) for treatment of migraines. Empty contents into 1-2 ounces (30-60 mL) of water. Mix well and drink immediately. 9 each 11   No current facility-administered medications for this visit.     Allergies as of 01/07/2017 - Review Complete 01/07/2017  Allergen Reaction Noted  . Hepatitis b virus vaccine  07/23/2011    Vitals: BP 124/84   Pulse 64   Ht 5\' 2"  (1.575 m)  Last Weight:  Wt Readings from Last 1 Encounters:  05/30/14 180 lb (81.6 kg)   Last Height:   Ht Readings from Last 1 Encounters:  01/07/17 5\' 2"  (1.575 m)   Physical exam: Exam: Gen: NAD, conversant, well nourised, obese, well groomed                     CV: RRR, no MRG. No Carotid Bruits. No peripheral edema, warm, nontender Eyes: Conjunctivae clear without exudates or hemorrhage  Neuro: Detailed Neurologic Exam  Speech:    Speech is normal; fluent and spontaneous with normal comprehension.  Cognition:    The patient is oriented to person, place, and time;     recent and remote memory intact;     language fluent;     normal attention, concentration,     fund of knowledge Cranial Nerves:    The pupils are equal, round, and reactive to light. The fundi are normal and spontaneous venous pulsations are present. Visual fields are full to finger confrontation. Extraocular movements are intact. Trigeminal sensation is intact and the muscles of mastication are normal. The face is symmetric. The palate elevates in the midline. Hearing intact. Voice is normal. Shoulder shrug is normal. The tongue has normal motion without fasciculations.   Coordination:    Normal finger to nose and heel to shin. Normal rapid alternating movements.   Gait:    Heel-toe and tandem gait are normal.   Motor Observation:    No asymmetry, no atrophy, and no  involuntary movements noted. Tone:    Normal muscle tone.    Posture:    Posture is normal. normal erect    Strength:    Strength is V/V in the upper and lower limbs.      Sensation: intact to LT     Reflex Exam:  DTR's:    Deep tendon reflexes in the upper and lower extremities are normal bilaterally.   Toes:    The toes are downgoing bilaterally.   Clonus:    Clonus is absent.       Assessment/Plan:  Patient with chronic migraines without aura who has failed  multiple medications. No medication overuse.  As far as your medications are concerned, I would like to suggest:  Continue Atenolol At onset  of headache take zomig, zofran and cambia/zipsor for acute management  Cambia 50mg  daily Zipsor 25mg  3-4x a day as needed Max 100mg  a day for all Diclofenac products  [employer] - will order Cambia - will order Botox, will request, feel that patient would be an excellent candidate for Botox and we can try [employer] as well  To prevent or relieve headaches, try the following: Cool Compress. Lie down and place a cool compress on your head.  Avoid headache triggers. If certain foods or odors seem to have triggered your migraines in the past, avoid them. A headache diary might help you identify triggers.  Include physical activity in your daily routine. Try a daily walk or other moderate aerobic exercise.  Manage stress. Find healthy ways to cope with the stressors, such as delegating tasks on your to-do list.  Practice relaxation techniques. Try deep breathing, yoga, massage and visualization.  Eat regularly. Eating regularly scheduled meals and maintaining a healthy diet might help prevent headaches. Also, drink plenty of fluids.  Follow a regular sleep schedule. Sleep deprivation might contribute to headaches Consider biofeedback. With this mind-body technique, you learn to control certain bodily functions - such as muscle tension, heart rate and blood pressure - to prevent  headaches or reduce headache pain.    Proceed to emergency room if you experience new or worsening symptoms or symptoms do not resolve, if you have new neurologic symptoms or if headache is severe, or for any concerning symptom.   Provided education and documentation from American headache Society toolbox including articles on: chronic migraine medication overuse headache, chronic migraines, prevention of migraines, behavioral and other nonpharmacologic treatments for headache.  Naomie Dean, MD  [facility] [address] [place], [place] [address]  Phone [phone] Fax [phone]

## 2017-01-08 ENCOUNTER — Encounter: Payer: Self-pay | Admitting: Neurology

## 2017-01-21 ENCOUNTER — Telehealth: Payer: Self-pay

## 2017-01-21 NOTE — Telephone Encounter (Signed)
[employer] service request form and rx completed, signed and faxed to [employer]. 

## 2017-01-22 ENCOUNTER — Telehealth: Payer: Self-pay | Admitting: Neurology

## 2017-01-22 NOTE — Telephone Encounter (Signed)
Patient will call [facility] and be sure they have correct insurance information.

## 2017-01-22 NOTE — Telephone Encounter (Signed)
I called the patient to inform her that the pharmacy will no fill her botox rx because they are stating she does not have active coverage. She did not answer so I left a VM asking her to call back.

## 2017-01-24 ENCOUNTER — Encounter: Payer: Self-pay | Admitting: Neurology

## 2017-01-24 ENCOUNTER — Ambulatory Visit (INDEPENDENT_AMBULATORY_CARE_PROVIDER_SITE_OTHER): Payer: 59 | Admitting: Neurology

## 2017-01-24 ENCOUNTER — Telehealth: Payer: Self-pay | Admitting: Neurology

## 2017-01-24 VITALS — BP 111/69 | HR 65 | Resp 16 | Ht 62.0 in | Wt 200.2 lb

## 2017-01-24 DIAGNOSIS — G43709 Chronic migraine without aura, not intractable, without status migrainosus: Secondary | ICD-10-CM

## 2017-01-24 NOTE — Telephone Encounter (Signed)
Pt needs to schedule botox.

## 2017-01-26 NOTE — Progress Notes (Signed)

## 2017-01-28 NOTE — Telephone Encounter (Signed)
I called to schedule the patient for her next injection. She did not answer so I left a VM asking her to call me back.  °

## 2017-02-26 DIAGNOSIS — Z8349 Family history of other endocrine, nutritional and metabolic diseases: Secondary | ICD-10-CM | POA: Insufficient documentation

## 2017-04-04 ENCOUNTER — Telehealth: Payer: Self-pay | Admitting: Neurology

## 2017-04-04 NOTE — Telephone Encounter (Signed)
Called to inform the patient that we have been instructed by the company [employer] to call the patients and verify the patients pharmacy and send the prescriptions to their pharmacy.  Called and LVM for pt to return call.

## 2017-04-10 NOTE — Telephone Encounter (Signed)
Attempted 2nd time reaching out to the patient to talk with her about the [employer]. No answer. LVM for call back

## 2017-04-17 NOTE — Telephone Encounter (Signed)
Pt returned call. I had asked her if she received the free medication from [employer] and she stated she hadnt but that she was ok with the current treatment plan that her and Dr Lucia GaskinsAhern are on. She didn't want to proceed forward with sending out the prescription. I informed her that at her follow up apt with Dr Lucia GaskinsAhern, that is coming up on the 30 th of October, she can discuss if they should address something differently.

## 2017-04-30 ENCOUNTER — Ambulatory Visit (INDEPENDENT_AMBULATORY_CARE_PROVIDER_SITE_OTHER): Payer: 59 | Admitting: Neurology

## 2017-04-30 ENCOUNTER — Telehealth: Payer: Self-pay | Admitting: Neurology

## 2017-04-30 ENCOUNTER — Encounter: Payer: Self-pay | Admitting: Neurology

## 2017-04-30 ENCOUNTER — Encounter (INDEPENDENT_AMBULATORY_CARE_PROVIDER_SITE_OTHER): Payer: Self-pay

## 2017-04-30 VITALS — BP 128/75 | HR 79 | Wt 203.2 lb

## 2017-04-30 DIAGNOSIS — G43709 Chronic migraine without aura, not intractable, without status migrainosus: Secondary | ICD-10-CM

## 2017-04-30 MED ORDER — RIZATRIPTAN BENZOATE 10 MG PO TBDP: 10.0000 mg | ORAL_TABLET | ORAL | 11 refills | Status: DC | PRN

## 2017-04-30 NOTE — Telephone Encounter (Signed)
I scheduled her botox follow up, hope this is okay.

## 2017-04-30 NOTE — Progress Notes (Signed)
Botox 200 units / 2 vials LOT: [id number] EXP: 06/2019 NDC: [id number] SP  0.9% Sodium Chloride 4 mL LOT: [id number] EXP: 11/30/2017 NDC: [id number] //BCrn

## 2017-04-30 NOTE — Progress Notes (Signed)

## 2017-05-01 DIAGNOSIS — G43709 Chronic migraine without aura, not intractable, without status migrainosus: Secondary | ICD-10-CM | POA: Insufficient documentation

## 2017-05-02 NOTE — Telephone Encounter (Signed)
Noted, thank you

## 2017-05-30 ENCOUNTER — Telehealth: Payer: Self-pay | Admitting: Neurology

## 2017-05-30 MED ORDER — CYCLOBENZAPRINE HCL 10 MG PO TABS: 10.0000 mg | ORAL_TABLET | Freq: Three times a day (TID) | ORAL | 3 refills | Status: DC | PRN

## 2017-05-30 NOTE — Telephone Encounter (Signed)
Refill of Flex[facility]il [facility] to [facility] [place]. Called patient to let her know. She stated she does want to continue taking flexeril (does not work) and wants to trying another muscle relaxer. I informed her Dr. Lucia Gaskinshern is out of the office but that I would pass message to her. She verbalized understanding and appreciation. Called [facility] in [place] and canceled prescription.

## 2017-05-30 NOTE — Telephone Encounter (Signed)
Pt called at last botox appt they discussed a muscle relaxer to be called to the pharmacy. Pt said that has not been done. Please call to advise. Pharmacy[facility]

## 2017-06-02 ENCOUNTER — Other Ambulatory Visit: Payer: Self-pay | Admitting: Neurology

## 2017-06-02 MED ORDER — TIZANIDINE HCL 4 MG PO TABS: 4.0000 mg | ORAL_TABLET | Freq: Four times a day (QID) | ORAL | 3 refills | Status: DC | PRN

## 2017-06-02 NOTE — Telephone Encounter (Signed)
I called in Tizanidine thanks

## 2017-06-03 NOTE — Telephone Encounter (Signed)
Called patient and LVM (ok per DPR) informing that patient that Dr. Lucia GaskinsAhern has ordered Tizanidine instead of Flexeril. I advised her that this medication can cause dizziness and drowsiness so do not drive while taking medication until she knows how it affects her. Encouraged patient to call back with any questions at all.

## 2017-06-17 ENCOUNTER — Telehealth: Payer: Self-pay | Admitting: Neurology

## 2017-06-17 MED ORDER — ATENOLOL 25 MG PO TABS: 75.0000 mg | ORAL_TABLET | Freq: Every day | ORAL | 11 refills | Status: DC

## 2017-06-17 NOTE — Telephone Encounter (Signed)
Pt is trying to get a refill of her atenolol (TENORMIN) 25 MG tablet at  [id number] [place], [place] - [id number] [phone] (Phone) [phone] (Fax)   And is being told by the pharmacy that her doctor is not allowing a refill, please call pt on this request

## 2017-06-17 NOTE — Telephone Encounter (Signed)
Called patient. She states she has been taking Atenolol 25 mg tablet (3 tabs for total 75 mg) since before she was seen by Dr. Lucia GaskinsAhern and was told to continue it. Per office note, continue Atenolol. I e-scribed refills to the patient's pharmacy.

## 2017-06-17 NOTE — Addendum Note (Signed)
Addended by: Bertram SavinULBERTSON, Lacie Landry L on: 06/17/2017 12:19 PM   Modules accepted: Orders

## 2017-07-19 ENCOUNTER — Emergency Department (HOSPITAL_BASED_OUTPATIENT_CLINIC_OR_DEPARTMENT_OTHER)
Admission: EM | Admit: 2017-07-19 | Discharge: 2017-07-19 | Disposition: A | Discharge status: Home / Self Care | Payer: 59 | Attending: Emergency Medicine | Admitting: Emergency Medicine

## 2017-07-19 ENCOUNTER — Encounter (HOSPITAL_BASED_OUTPATIENT_CLINIC_OR_DEPARTMENT_OTHER): Payer: Self-pay | Admitting: Emergency Medicine

## 2017-07-19 ENCOUNTER — Other Ambulatory Visit: Payer: Self-pay

## 2017-07-19 DIAGNOSIS — T7840XA Allergy, unspecified, initial encounter: Secondary | ICD-10-CM | POA: Insufficient documentation

## 2017-07-19 DIAGNOSIS — F17200 Nicotine dependence, unspecified, uncomplicated: Secondary | ICD-10-CM | POA: Insufficient documentation

## 2017-07-19 DIAGNOSIS — Z79899 Other long term (current) drug therapy: Secondary | ICD-10-CM | POA: Insufficient documentation

## 2017-07-19 MED ORDER — FAMOTIDINE 20 MG PO TABS: 20.0000 mg | ORAL_TABLET | Freq: Two times a day (BID) | ORAL | 0 refills | Status: DC

## 2017-07-19 MED ORDER — FAMOTIDINE 20 MG PO TABS
20.0000 mg | ORAL_TABLET | Freq: Once | ORAL | Status: AC
  Administered 2017-07-19: 20 mg via ORAL
  Filled 2017-07-19: qty 1

## 2017-07-19 MED ORDER — DEXAMETHASONE SODIUM PHOSPHATE 10 MG/ML IJ SOLN
10.0000 mg | Freq: Once | INTRAMUSCULAR | Status: AC
  Administered 2017-07-19: 10 mg via INTRAMUSCULAR
  Filled 2017-07-19: qty 1

## 2017-07-19 MED ORDER — PREDNISONE 20 MG PO TABS: ORAL_TABLET | ORAL | 0 refills | Status: DC

## 2017-07-19 NOTE — ED Triage Notes (Signed)
Pt states her throat is swelling and c/o chest pain and arm numbness. Pt states she just started taking augmentin and itching started after taking medication. Pt took 2 benadryl as 1930 and 2130 with out improvement. Pt in NAD. No airway compromise.

## 2017-07-19 NOTE — ED Provider Notes (Signed)
[facility] EMERGENCY DEPARTMENT Provider Note   CSN: [id number] Arrival date & time: 07/19/17  0144     History   Chief Complaint Chief Complaint  Patient presents with  . Allergic Reaction    HPI Krista Henson is a 27 y.o. female.  The history is provided by the patient.  Allergic Reaction  Presenting symptoms: itching   Presenting symptoms: no rash, no swelling and no wheezing   Duration:  2 hours Prior allergic episodes:  No prior episodes Context: medications   Context comment:  Augmentin Relieved by:  Nothing Worsened by:  Nothing Ineffective treatments:  Antihistamines Taking Augmentin for sinus problems.  Took dose at 7 pm and 2-3 hours after started itching.  No swelling no rash.  No changes in speech  Past Medical History:  Diagnosis Date  . Anxiety   . GERD (gastroesophageal reflux disease)   . Migraine   . Polycystic ovary disease     Patient Active Problem List   Diagnosis Date Noted  . Chronic migraine without aura without status migrainosus, not intractable 05/01/2017  . Migraine with aura and without status migrainosus, not intractable 03/27/2016    Past Surgical History:  Procedure Laterality Date  . BUNIONECTOMY Left 2014  . TONSILLECTOMY AND ADENOIDECTOMY  2010  . UPPER GI ENDOSCOPY  2017  . WISDOM TOOTH EXTRACTION  2005    OB History    No data available       Home Medications    Prior to Admission medications   Medication Sig Start Date End Date Taking? Authorizing Provider  atenolol (TENORMIN) 25 MG tablet Take 75 mg by mouth daily.  12/24/16   [provider]  atenolol (TENORMIN) 25 MG tablet Take 3 tablets (75 mg total) by mouth daily. 06/17/17   Anson Fret, MD  cyclobenzaprine (FLEXERIL) 10 MG tablet Take 1 tablet (10 mg total) by mouth 3 (three) times daily as needed for muscle spasms. 05/30/17   Anson Fret, MD  Diclofenac Potassium (CAMBIA) 50 MG PACK Take 1 packet (50 mg) for treatment of  migraines. Empty contents into 1-2 ounces (30-60 mL) of water. Mix well and drink immediately. 01/07/17   Anson Fret, MD  escitalopram (LEXAPRO) 20 MG tablet Take 20 mg by mouth daily.    [provider]  esomeprazole (NEXIUM) 20 MG capsule Take 20 mg by mouth daily at 12 noon.    [provider]  famotidine (PEPCID) 20 MG tablet Take 1 tablet (20 mg total) by mouth 2 (two) times daily. 07/19/17   Tameka Hoiland, MD  Levonorgestrel [facility]) 19.5 MG IUD by Intrauterine route.    [provider]  naproxen (NAPROSYN) 500 MG tablet Take 500 mg by mouth as needed.  12/27/16   [provider]  ondansetron (ZOFRAN) 8 MG tablet Take by mouth.    [provider]  predniSONE (DELTASONE) 20 MG tablet 3 tabs po day one, then 2 po daily x 4 days 07/19/17   Oneil Behney, MD  rizatriptan (MAXALT-MLT) 10 MG disintegrating tablet Take 1 tablet (10 mg total) by mouth as needed for migraine. May repeat in 2 hours if needed 04/30/17   Anson Fret, MD  tiZANidine (ZANAFLEX) 4 MG tablet Take 1 tablet (4 mg total) by mouth every 6 (six) hours as needed for muscle spasms. 06/02/17   Anson Fret, MD  zolmitriptan (ZOMIG) 5 MG nasal solution Place into the nose. 12/25/16   [provider]  Family History Family History  Problem Relation Age of Onset  . Hyperthyroidism Mother   . Migraines Mother   . Hyperthyroidism Maternal Grandmother   . Diabetes Maternal Grandfather   . Stroke Paternal Grandmother   . Pancreatic cancer Paternal Grandfather     Social History Social History   Tobacco Use  . Smoking status: Current Every Day Smoker    Packs/day: 0.50  . Smokeless tobacco: Never Used  Substance Use Topics  . Alcohol use: Yes    Alcohol/week: 3.6 oz    Types: 6 Standard drinks or equivalent per week    Comment: 3x/wk  . Drug use: No     Allergies   Hepatitis b virus vaccine and Penicillins   Review of Systems Review of Systems    HENT: Negative for drooling, facial swelling and sore throat.   Respiratory: Negative for shortness of breath and wheezing.   Cardiovascular: Negative for chest pain.  Skin: Positive for itching. Negative for rash.  All other systems reviewed and are negative.    Physical Exam Updated Vital Signs BP (!) 138/101 (BP Location: Left Arm)   Pulse 100   Temp 98.8 F (37.1 C) (Oral)   Resp 20   Ht 5\' 2"  (1.575 m)   Wt 88.5 kg (195 lb)   SpO2 100%   BMI 35.67 kg/m   Physical Exam  Constitutional: She is oriented to person, place, and time. She appears well-developed and well-nourished. No distress.  HENT:  Head: Normocephalic and atraumatic.  Nose: Nose normal.  Mouth/Throat: No oropharyngeal exudate.  No swelling of the lips tongue or uvula  Eyes: Conjunctivae are normal. Pupils are equal, round, and reactive to light.  Neck: Normal range of motion. Neck supple.  Cardiovascular: Normal rate, regular rhythm, normal heart sounds and intact distal pulses.  Pulmonary/Chest: Effort normal and breath sounds normal. No stridor. No respiratory distress. She has no wheezes. She has no rales.  Abdominal: Soft. Bowel sounds are normal. There is no tenderness.  Musculoskeletal: Normal range of motion.  Neurological: She is alert and oriented to person, place, and time.  Skin: Skin is warm and dry. Capillary refill takes less than 2 seconds. No rash noted.  Psychiatric: She has a normal mood and affect.  Nursing note and vitals reviewed.    ED Treatments / Results  Labs (all labs ordered are listed, but only abnormal results are displayed) Labs Reviewed - No data to display  EKG  EKG Interpretation None       Radiology No results found.  Procedures Procedures (including critical care time)  Medications Ordered in ED Medications  dexamethasone (DECADRON) injection 10 mg (10 mg Intramuscular Given 07/19/17 0306)  famotidine (PEPCID) tablet 20 mg (20 mg Oral Given 07/19/17  0306)       Final Clinical Impressions(s) / ED Diagnoses   Final diagnoses:  Allergic reaction, initial encounter  Return for worsening pain, vomiting blood inability to pass urine,  fevers > 100.4 unrelieved by medication, shortness of breath, intractable vomiting, or diarrhea, abdominal pain, Inability to tolerate liquids or food, cough, altered mental status or any concerns. No signs of systemic illness or infection. The patient is nontoxic-appearing on exam and vital signs are within normal limits.    I have reviewed the triage vital signs and the nursing notes. Pertinent labs &imaging results that were available during my care of the patient were reviewed by me and considered in my medical decision making (see chart for details).  After history,  exam, and medical workup I feel the patient has been appropriately medically screened and is safe for discharge home. Pertinent diagnoses were discussed with the patient. Patient was given return precautions.    ED Discharge Orders        Ordered    predniSONE (DELTASONE) 20 MG tablet     07/19/17 0416    famotidine (PEPCID) 20 MG tablet  2 times daily     07/19/17 0416       Melyna Huron, MD 07/19/17 0422

## 2017-08-01 ENCOUNTER — Ambulatory Visit: Payer: 59 | Admitting: Neurology

## 2017-08-06 ENCOUNTER — Ambulatory Visit: Payer: 59 | Admitting: Neurology

## 2017-08-14 ENCOUNTER — Ambulatory Visit: Payer: 59 | Admitting: Neurology

## 2017-08-14 ENCOUNTER — Encounter: Payer: Self-pay | Admitting: Neurology

## 2017-08-14 VITALS — BP 136/79 | HR 83

## 2017-08-14 DIAGNOSIS — G43709 Chronic migraine without aura, not intractable, without status migrainosus: Secondary | ICD-10-CM | POA: Diagnosis not present

## 2017-08-14 NOTE — Patient Instructions (Signed)
Magnesium citrate 400mg  to 600mg  daily,  Also: riboflavin 400mg  daily , Coenzyme Q 10 100mg  three times daily

## 2017-08-14 NOTE — Progress Notes (Signed)
Botox- 100 units x 2 vials Lot: [id number] Expiration: 12/2019 NDC: [id number]  Bacteriostatic 0.9% Sodium Chloride- 4mL total Lot: [id number] Expiration: 10/31/2018 NDC: [id number]  Dx: I69.629G43.709 S/P //BCrn

## 2017-08-17 NOTE — Progress Notes (Signed)
  Interval history: Tremendous improvement, much greater than 50% decrease in migraine frequency and severity and duration Consent Form Botulism Toxin Injection For Chronic Migraine  Botulism toxin has been approved by the Federal drug administration for treatment of chronic migraine. Botulism toxin does not cure chronic migraine and it may not be effective in some patients.  The administration of botulism toxin is accomplished by injecting a small amount of toxin into the muscles of the neck and head. Dosage must be titrated for each individual. Any benefits resulting from botulism toxin tend to wear off after 3 months with a repeat injection required if benefit is to be maintained. Injections are usually done every 3-4 months with maximum effect peak achieved by about 2 or 3 weeks. Botulism toxin is expensive and you should be sure of what costs you will incur resulting from the injection.  The side effects of botulism toxin use for chronic migraine may include:   -Transient, and usually mild, facial weakness with facial injections  -Transient, and usually mild, head or neck weakness with head/neck injections  -Reduction or loss of forehead facial animation due to forehead muscle              weakness  -Eyelid drooping  -Dry eye  -Pain at the site of injection or bruising at the site of injection  -Double vision  -Potential unknown long term risks  Contraindications: You should not have Botox if you are pregnant, nursing, allergic to albumin, have an infection, skin condition, or muscle weakness at the site of the injection, or have myasthenia gravis, Lambert-Eaton syndrome, or ALS.  It is also possible that as with any injection, there may be an allergic reaction or no effect from the medication. Reduced effectiveness after repeated injections is sometimes seen and rarely infection at the injection site may occur. All care will be taken to prevent these side effects. If therapy is given  over a long time, atrophy and wasting in the muscle injected may occur. Occasionally the patient's become refractory to treatment because they develop antibodies to the toxin. In this event, therapy needs to be modified.  I have read the above information and consent to the administration of botulism toxin.    ______________  _____   _________________  Patient signature     Date   Witness signature       BOTOX PROCEDURE NOTE FOR MIGRAINE HEADACHE    Contraindications and precautions discussed with patient(above). Aseptic procedure was observed and patient tolerated procedure. Procedure performed by Dr. Toni Maryruth Apple  The condition has existed for more than 6 months, and pt does not have a diagnosis of ALS, Myasthenia Gravis or Lambert-Eaton Syndrome. Risks and benefits of injections discussed and pt agrees to proceed with the procedure. Written consent obtained  These injections are medically necessary. He receives good benefits from these injections. These injections do not cause sedations or hallucinations which the oral therapies may cause.  Indication/Diagnosis: chronic migraine BOTOX(J0585) injection was performed according to protocol by [employer]. 200 units of BOTOX was dissolved into 4 cc NS.  NDC: [address]-01  Description of procedure:  The patient was placed in a sitting position. The standard protocol was used for Botox as follows, with 5 units of Botox injected at each site:   -Procerus muscle, midline injection  -Corrugator muscle, bilateral injection  -Frontalis muscle, bilateral injection, with 2 sites each side, medial injection was performed in the upper one third of the frontalis muscle, in the region vertical   from the medial inferior edge of the superior orbital rim. The lateral injection was again in the upper one third of the forehead vertically above the lateral limbus of the cornea, 1.5 cm lateral to the medial injection site.  -Temporalis muscle  injection, 4 sites, bilaterally. The first injection was 3 cm above the tragus of the ear, second injection site was 1.5 cm to 3 cm up from the first injection site in line with the tragus of the ear. The third injection site was 1.5-3 cm forward between the first 2 injection sites. The fourth injection site was 1.5 cm posterior to the second injection site.  -Occipitalis muscle injection, 3 sites, bilaterally. The first injection was done one half way between the occipital protuberance and the tip of the mastoid process behind the ear. The second injection site was done lateral and superior to the first, 1 fingerbreadth from the first injection. The third injection site was 1 fingerbreadth superiorly and medially from the first injection site.  -Cervical paraspinal muscle injection, 2 sites, bilateral knee first injection site was 1 cm from the midline of the cervical spine, 3 cm inferior to the lower border of the occipital protuberance. The second injection site was 1.5 cm superiorly and laterally to the first injection site.  -Trapezius muscle injection was performed at 3 sites, bilaterally. The first injection site was in the upper trapezius muscle halfway between the inflection point of the neck, and the acromion. The second injection site was one half way between the acromion and the first injection site. The third injection was done between the first injection site and the inflection point of the neck.   Will return for repeat injection in 3 months.   A 200 unit sof Botox was used, 155 units were injected, the rest of the Botox was wasted. The patient tolerated the procedure well, there were no complications of the above procedure.   

## 2017-09-26 ENCOUNTER — Telehealth: Payer: Self-pay | Admitting: Neurology

## 2017-09-26 NOTE — Telephone Encounter (Signed)
Pt called requesting a call back to discuss Dr. Lucia GaskinsAhern picking up ondansetron (ZOFRAN) 8 MG tablet the dissolvable tablets

## 2017-09-27 MED ORDER — ONDANSETRON 8 MG PO TBDP: 8.0000 mg | ORAL_TABLET | Freq: Three times a day (TID) | ORAL | 11 refills | Status: DC | PRN

## 2017-09-27 NOTE — Telephone Encounter (Signed)
Dr. Lucia GaskinsAhern has given a verbal order for Ondansetron 8 mg ODT PO 1 tablet every 8 hours prn n/v, #20, refills 11.   Spoke with the patient and informed her that Dr. Lucia GaskinsAhern has authorized a refill of Ondansetron 8 mg ODT tablets., 1 tablet every 8 hours prn. She verbalized appreciation. Med e-scribed to [facility] pharmacy, [facility] [address] in [place].

## 2017-11-14 ENCOUNTER — Encounter: Payer: Self-pay | Admitting: Neurology

## 2017-11-14 ENCOUNTER — Ambulatory Visit: Payer: 59 | Admitting: Neurology

## 2017-11-14 VITALS — BP 112/80 | HR 65

## 2017-11-14 DIAGNOSIS — G43709 Chronic migraine without aura, not intractable, without status migrainosus: Secondary | ICD-10-CM

## 2017-11-14 DIAGNOSIS — M7918 Myalgia, other site: Secondary | ICD-10-CM

## 2017-11-14 DIAGNOSIS — S29019A Strain of muscle and tendon of unspecified wall of thorax, initial encounter: Secondary | ICD-10-CM

## 2017-11-14 NOTE — Progress Notes (Signed)
 Interval history: Tremendous improvement, much greater than 50% decrease in migraine frequency and severity and duration. NO JAW MUSCLES. Not eyes. +temples. Do more in the c2-c4 paraspinals bilats.    On the forehead at top 55 then across middle forehead 5555. + traps. + levators. She had some forehead droop in the past also felt her smile was impaired after masseter injection.    Request [facility] on [address] for Dry needling for myofascial cervical and thoracic pain contributing to pain, headaches, decreased range of motion. Failed conservative therapy forover 6 months.   Consent Form Botulism Toxin Injection For Chronic Migraine  Botulism toxin has been approved by the Federal drug administration for treatment of chronic migraine. Botulism toxin does not cure chronic migraine and it may not be effective in some patients.  The administration of botulism toxin is accomplished by injecting a small amount of toxin into the muscles of the neck and head. Dosage must be titrated for each individual. Any benefits resulting from botulism toxin tend to wear off after 3 months with a repeat injection required if benefit is to be maintained. Injections are usually done every 3-4 months with maximum effect peak achieved by about 2 or 3 weeks. Botulism toxin is expensive and you should be sure of what costs you will incur resulting from the injection.  The side effects of botulism toxin use for chronic migraine may include:   -Transient, and usually mild, facial weakness with facial injections  -Transient, and usually mild, head or neck weakness with head/neck injections  -Reduction or loss of forehead facial animation due to forehead muscle              weakness  -Eyelid drooping  -Dry eye  -Pain at the site of injection or bruising at the site of injection  -Double vision  -Potential unknown long term risks  Contraindications: You should not have Botox if you are pregnant, nursing, allergic to  albumin, have an infection, skin condition, or muscle weakness at the site of the injection, or have myasthenia gravis, Lambert-Eaton syndrome, or ALS.  It is also possible that as with any injection, there may be an allergic reaction or no effect from the medication. Reduced effectiveness after repeated injections is sometimes seen and rarely infection at the injection site may occur. All care will be taken to prevent these side effects. If therapy is given over a long time, atrophy and wasting in the muscle injected may occur. Occasionally the patient's become refractory to treatment because they develop antibodies to the toxin. In this event, therapy needs to be modified.  I have read the above information and consent to the administration of botulism toxin.    ______________  _____   _________________  Patient signature     Date   Witness signature       BOTOX PROCEDURE NOTE FOR MIGRAINE HEADACHE    Contraindications and precautions discussed with patient(above). Aseptic procedure was observed and patient tolerated procedure. Procedure performed by Dr. Toni Ahern  The condition has existed for more than 6 months, and pt does not have a diagnosis of ALS, Myasthenia Gravis or Lambert-Eaton Syndrome. Risks and benefits of injections discussed and pt agrees to proceed with the procedure. Written consent obtained  These injections are medically necessary. He receives good benefits from these injections. These injections do not cause sedations or hallucinations which the oral therapies may cause.  Indication/Diagnosis: chronic migraine BOTOX(J0585) injection was performed according to protocol by [employer]. 200 units   was dissolved into 4 cc NS.  NDC: [phone]  Description of procedure:  The patient was placed in a sitting position. The standard protocol was used for Botox as follows, with 5 units of Botox injected at each site:   -Procerus muscle, midline  injection  -Corrugator muscle, bilateral injection  -Frontalis muscle, bilateral injection, with 2 sites each side, medial injection was performed in the upper one third of the frontalis muscle, in the region vertical from the medial inferior edge of the superior orbital rim. The lateral injection was again in the upper one third of the forehead vertically above the lateral limbus of the cornea, 1.5 cm lateral to the medial injection site.  -Temporalis muscle injection, 4 sites, bilaterally. The first injection was 3 cm above the tragus of the ear, second injection site was 1.5 cm to 3 cm up from the first injection site in line with the tragus of the ear. The third injection site was 1.5-3 cm forward between the first 2 injection sites. The fourth injection site was 1.5 cm posterior to the second injection site.  -Occipitalis muscle injection, 3 sites, bilaterally. The first injection was done one half way between the occipital protuberance and the tip of the mastoid process behind the ear. The second injection site was done lateral and superior to the first, 1 fingerbreadth from the first injection. The third injection site was 1 fingerbreadth superiorly and medially from the first injection site.  -Cervical paraspinal muscle injection, 2 sites, bilateral knee first injection site was 1 cm from the midline of the cervical spine, 3 cm inferior to the lower border of the occipital protuberance. The second injection site was 1.5 cm superiorly and laterally to the first injection site.  -Trapezius muscle injection was performed at 3 sites, bilaterally. The first injection site was in the upper trapezius muscle halfway between the inflection point of the neck, and the acromion. The second injection site was one half way between the acromion and the first injection site. The third injection was done between the first injection site and the inflection point of the neck.   Will return for repeat injection  in 3 months.   A 200 unit sof Botox was used, 155 units were injected, the rest of the Botox was wasted. The patient tolerated the procedure well, there were no complications of the above procedure.

## 2017-11-14 NOTE — Progress Notes (Signed)
Botox- 100 units x 2 vials Lot: [id number] Expiration: 03/2020 NDC: 1610-9604-54  Bacteriostatic 0.9% Sodium Chloride- 4mL total Lot: [id number] Expiration: 10/31/2018 NDC: 1478-2956-21  Dx: H08.657 S/P  //BCrn

## 2018-02-06 ENCOUNTER — Telehealth: Payer: Self-pay | Admitting: Neurology

## 2018-02-06 NOTE — Telephone Encounter (Signed)
Pt requesting a call to schedule botox appt

## 2018-02-10 ENCOUNTER — Other Ambulatory Visit: Payer: Self-pay | Admitting: Neurology

## 2018-02-10 NOTE — Telephone Encounter (Signed)
I called and scheduled the patient.  °

## 2018-02-11 NOTE — Telephone Encounter (Signed)
I called in prescription to [facility].

## 2018-02-19 ENCOUNTER — Ambulatory Visit: Payer: 59 | Admitting: Neurology

## 2018-02-19 DIAGNOSIS — G43709 Chronic migraine without aura, not intractable, without status migrainosus: Secondary | ICD-10-CM

## 2018-02-19 MED ORDER — ATENOLOL 100 MG PO TABS: 100.0000 mg | ORAL_TABLET | Freq: Every day | ORAL | 4 refills | Status: DC

## 2018-02-19 NOTE — Progress Notes (Signed)
[facility]  NDC [phone] x 2,  LOT [id number] x 2,  Exp 07/2020 x 2.

## 2018-02-19 NOTE — Progress Notes (Signed)
Interval history: Tremendous improvement, much greater than 50% decrease in migraine frequency and severity and duration. NO JAW MUSCLES. Not eyes. +temples. Do more in the c2-c4 paraspinals bilats.    On the forehead at top 55 then across middle forehead 5555. + traps. + levators. She had some forehead droop in the past also felt her smile was impaired after masseter injection.    [facility] [address] for Dry needling for myofascial cervical and thoracic pain contributing to pain, headaches, decreased range of motion. Failed conservative therapy forover 6 months.   Consent Form Botulism Toxin Injection For Chronic Migraine  Botulism toxin has been approved by the Federal drug administration for treatment of chronic migraine. Botulism toxin does not cure chronic migraine and it may not be effective in some patients.  The administration of botulism toxin is accomplished by injecting a small amount of toxin into the muscles of the neck and head. Dosage must be titrated for each individual. Any benefits resulting from botulism toxin tend to wear off after 3 months with a repeat injection required if benefit is to be maintained. Injections are usually done every 3-4 months with maximum effect peak achieved by about 2 or 3 weeks. Botulism toxin is expensive and you should be sure of what costs you will incur resulting from the injection.  The side effects of botulism toxin use for chronic migraine may include:   -Transient, and usually mild, facial weakness with facial injections  -Transient, and usually mild, head or neck weakness with head/neck injections  -Reduction or loss of forehead facial animation due to forehead muscle              weakness  -Eyelid drooping  -Dry eye  -Pain at the site of injection or bruising at the site of injection  -Double vision  -Potential unknown long term risks  Contraindications: You should not have Botox if you are pregnant, nursing, allergic to  albumin, have an infection, skin condition, or muscle weakness at the site of the injection, or have myasthenia gravis, Lambert-Eaton syndrome, or ALS.  It is also possible that as with any injection, there may be an allergic reaction or no effect from the medication. Reduced effectiveness after repeated injections is sometimes seen and rarely infection at the injection site may occur. All care will be taken to prevent these side effects. If therapy is given over a long time, atrophy and wasting in the muscle injected may occur. Occasionally the patient's become refractory to treatment because they develop antibodies to the toxin. In this event, therapy needs to be modified.  I have read the above information and consent to the administration of botulism toxin.    ______________  _____   _________________  Patient signature     Date   Witness signature       BOTOX PROCEDURE NOTE FOR MIGRAINE HEADACHE    Contraindications and precautions discussed with patient(above). Aseptic procedure was observed and patient tolerated procedure. Procedure performed by Dr. Artemio Alyoni Londa Mackowski  The condition has existed for more than 6 months, and pt does not have a diagnosis of ALS, Myasthenia Gravis or Lambert-Eaton Syndrome. Risks and benefits of injections discussed and pt agrees to proceed with the procedure. Written consent obtained  These injections are medically necessary. He receives good benefits from these injections. These injections do not cause sedations or hallucinations which the oral therapies may cause.  Indication/Diagnosis: chronic migraine BOTOX(J0585) injection was performed according to protocol by [facility]. 200 units  of BOTOX was dissolved into 4 cc NS.  NDC: [id number]  Description of procedure:  The patient was placed in a sitting position. The standard protocol was used for Botox as follows, with 5 units of Botox injected at each site:   -Procerus muscle, midline  injection  -Corrugator muscle, bilateral injection  -Frontalis muscle, bilateral injection, with 2 sites each side, medial injection was performed in the upper one third of the frontalis muscle, in the region vertical from the medial inferior edge of the superior orbital rim. The lateral injection was again in the upper one third of the forehead vertically above the lateral limbus of the cornea, 1.5 cm lateral to the medial injection site.  -Temporalis muscle injection, 4 sites, bilaterally. The first injection was 3 cm above the tragus of the ear, second injection site was 1.5 cm to 3 cm up from the first injection site in line with the tragus of the ear. The third injection site was 1.5-3 cm forward between the first 2 injection sites. The fourth injection site was 1.5 cm posterior to the second injection site.  -Occipitalis muscle injection, 3 sites, bilaterally. The first injection was done one half way between the occipital protuberance and the tip of the mastoid process behind the ear. The second injection site was done lateral and superior to the first, 1 fingerbreadth from the first injection. The third injection site was 1 fingerbreadth superiorly and medially from the first injection site.  -Cervical paraspinal muscle injection, 2 sites, bilateral knee first injection site was 1 cm from the midline of the cervical spine, 3 cm inferior to the lower border of the occipital protuberance. The second injection site was 1.5 cm superiorly and laterally to the first injection site.  -Trapezius muscle injection was performed at 3 sites, bilaterally. The first injection site was in the upper trapezius muscle halfway between the inflection point of the neck, and the acromion. The second injection site was one half way between the acromion and the first injection site. The third injection was done between the first injection site and the inflection point of the neck.   Will return for repeat injection  in 3 months.   A 200 unit sof Botox was used, 155 units were injected, the rest of the Botox was wasted. The patient tolerated the procedure well, there were no complications of the above procedure.

## 2018-02-19 NOTE — Progress Notes (Signed)
Botox- 100 units x 2 vials Lot: [id number] Expiration: 07/2020 NDC: [id number]  Bacteriostatic 0.9% Sodium Chloride- 4mL total Lot: [id number] Expiration: 04/02/2019 NDC: [id number]  Dx: [id number] S/P

## 2018-03-07 ENCOUNTER — Telehealth: Payer: Self-pay | Admitting: Neurology

## 2018-03-07 NOTE — Telephone Encounter (Signed)
Called the patient and informed her that Dr Lucia Gaskins is ok with her coming to get an infusion. Spoke with Inetta Fermo in infusion and the patient can come and check in 11:45 am. Order discussed and given to infusion suite. Pt verbalized understanding.

## 2018-03-07 NOTE — Telephone Encounter (Signed)
Pt's had a HA for the past 3 days. She is wanting to come in for an infusion. I told her the clinic closes at noon today. Please call to advise

## 2018-05-15 ENCOUNTER — Ambulatory Visit (INDEPENDENT_AMBULATORY_CARE_PROVIDER_SITE_OTHER): Payer: 59 | Admitting: Neurology

## 2018-05-15 DIAGNOSIS — G43011 Migraine without aura, intractable, with status migrainosus: Secondary | ICD-10-CM

## 2018-05-15 MED ORDER — METHYLPREDNISOLONE 4 MG PO TBPK: ORAL_TABLET | ORAL | 1 refills | Status: DC

## 2018-05-15 MED ORDER — LORAZEPAM 0.5 MG PO TABS: 0.5000 mg | ORAL_TABLET | Freq: Three times a day (TID) | ORAL | 4 refills | Status: DC

## 2018-05-15 MED ORDER — BUPROPION HCL ER (SR) 150 MG PO TB12: 150.0000 mg | ORAL_TABLET | Freq: Two times a day (BID) | ORAL | 6 refills | Status: DC

## 2018-05-15 NOTE — Progress Notes (Signed)
Patient is having a severe migraine. Will perform nerve blocks today instead of botox.  Performed by Dr. Lucia GaskinsAhern M.D.  All procedures a documented blood were medically necessary, reasonable and appropriate based on the patient's history, medical diagnosis and physician opinion. Verbal informed consent was obtained from the patient, patient was informed of potential risk of procedure, including bruising, bleeding, hematoma formation, infection, muscle weakness, muscle pain, numbness, transient hypertension, transient hyperglycemia and transient insomnia among others. All areas injected were topically clean with isopropyl rubbing alcohol. Nonsterile nonlatex gloves were worn during the procedure.  1. Supraorbital nerve block (64400): Supraorbital nerve site was identified along the incision of the frontal bone on the orbital/supraorbital ridge. Medication was injected into the left and right supraorbital nerve areas. Patient's condition is associated with inflammation of the supraorbital and associated muscle groups. Injection was deemed medically necessary, reasonable and appropriate. Injection represents a separate and unique surgical service.  Also has episodes of anxiety, tried propranolol will give limited ativan only to be taken as needed for anxiety/panick several times a week maximum. Will start Wellbutrin (she gained weight on SSRIs)

## 2018-05-15 NOTE — Patient Instructions (Signed)
Lorazepam tablets What is this medicine? LORAZEPAM (lor A ze pam) is a benzodiazepine. It is used to treat anxiety. This medicine may be used for other purposes; ask your health care provider or pharmacist if you have questions. COMMON BRAND NAME(S): Ativan What should I tell my health care provider before I take this medicine? They need to know if you have any of these conditions: -glaucoma -history of drug or alcohol abuse problem -kidney disease -liver disease -lung or breathing disease, like asthma -mental illness -myasthenia gravis -Parkinson's disease -suicidal thoughts, plans, or attempt; a previous suicide attempt by you or a family member -an unusual or allergic reaction to lorazepam, other medicines, foods, dyes, or preservatives -pregnant or trying to get pregnant -breast-feeding How should I use this medicine? Take this medicine by mouth with a glass of water. Follow the directions on the prescription label. Take your medicine at regular intervals. Do not take it more often than directed. Do not stop taking except on your doctor's advice. A special MedGuide will be given to you by the pharmacist with each prescription and refill. Be sure to read this information carefully each time. Talk to your pediatrician regarding the use of this medicine in children. While this drug may be used in children as young as 12 years for selected conditions, precautions do apply. Overdosage: If you think you have taken too much of this medicine contact a poison control center or emergency room at once. NOTE: This medicine is only for you. Do not share this medicine with others. What if I miss a dose? If you miss a dose, take it as soon as you can. If it is almost time for your next dose, take only that dose. Do not take double or extra doses. What may interact with this medicine? Do not take this medicine with any of the following medications: -narcotic medicines for cough -sodium  oxybate This medicine may also interact with the following medications: -alcohol -antihistamines for allergy, cough and cold -certain medicines for anxiety or sleep -certain medicines for depression, like amitriptyline, fluoxetine, sertraline -certain medicines for seizures like carbamazepine, phenobarbital, phenytoin, primidone -general anesthetics like lidocaine, pramoxine, tetracaine -MAOIs like Carbex, Eldepryl, Marplan, Nardil, and Parnate -medicines that relax muscles for surgery -narcotic medicines for pain -phenothiazines like chlorpromazine, mesoridazine, prochlorperazine, thioridazine This list may not describe all possible interactions. Give your health care provider a list of all the medicines, herbs, non-prescription drugs, or dietary supplements you use. Also tell them if you smoke, drink alcohol, or use illegal drugs. Some items may interact with your medicine. What should I watch for while using this medicine? Tell your doctor or health care professional if your symptoms do not start to get better or if they get worse. Do not stop taking except on your doctor's advice. You may develop a severe reaction. Your doctor will tell you how much medicine to take. You may get drowsy or dizzy. Do not drive, use machinery, or do anything that needs mental alertness until you know how this medicine affects you. To reduce the risk of dizzy and fainting spells, do not stand or sit up quickly, especially if you are an older patient. Alcohol may increase dizziness and drowsiness. Avoid alcoholic drinks. If you are taking another medicine that also causes drowsiness, you may have more side effects. Give your health care provider a list of all medicines you use. Your doctor will tell you how much medicine to take. Do not take more medicine than directed.  Call emergency for help if you have problems breathing or unusual sleepiness. What side effects may I notice from receiving this medicine? Side  effects that you should report to your doctor or health care professional as soon as possible: -allergic reactions like skin rash, itching or hives, swelling of the face, lips, or tongue -breathing problems -confusion -loss of balance or coordination -signs and symptoms of low blood pressure like dizziness; feeling faint or lightheaded, falls; unusually weak or tired -suicidal thoughts or other mood changes Side effects that usually do not require medical attention (report to your doctor or health care professional if they continue or are bothersome): -dizziness -headache -nausea, vomiting -tiredness This list may not describe all possible side effects. Call your doctor for medical advice about side effects. You may report side effects to FDA at 1-800-FDA-1088. Where should I keep my medicine? Keep out of the reach of children. This medicine can be abused. Keep your medicine in a safe place to protect it from theft. Do not share this medicine with anyone. Selling or giving away this medicine is dangerous and against the law. This medicine may cause accidental overdose and death if taken by other adults, children, or pets. Mix any unused medicine with a substance like cat litter or coffee grounds. Then throw the medicine away in a sealed container like a sealed bag or a coffee can with a lid. Do not use the medicine after the expiration date. Store at room temperature between 20 and 25 degrees C (68 and 77 degrees F). Protect from light. Keep container tightly closed. NOTE: This sheet is a summary. It may not cover all possible information. If you have questions about this medicine, talk to your doctor, pharmacist, or health care provider.  2018 Elsevier/Gold Standard (2015-03-17 15:54:27)   Bupropion tablets (Depression/Mood Disorders) What is this medicine? BUPROPION (byoo PROE pee on) is used to treat depression. This medicine may be used for other purposes; ask your health care provider  or pharmacist if you have questions. COMMON BRAND NAME(S): Wellbutrin What should I tell my health care provider before I take this medicine? They need to know if you have any of these conditions: -an eating disorder, such as anorexia or bulimia -bipolar disorder or psychosis -diabetes or high blood sugar, treated with medication -glaucoma -heart disease, previous heart attack, or irregular heart beat -head injury or brain tumor -high blood pressure -kidney or liver disease -seizures -suicidal thoughts or a previous suicide attempt -Tourette's syndrome -weight loss -an unusual or allergic reaction to bupropion, other medicines, foods, dyes, or preservatives -breast-feeding -pregnant or trying to become pregnant How should I use this medicine? Take this medicine by mouth with a glass of water. Follow the directions on the prescription label. You can take it with or without food. If it upsets your stomach, take it with food. Take your medicine at regular intervals. Do not take your medicine more often than directed. Do not stop taking this medicine suddenly except upon the advice of your doctor. Stopping this medicine too quickly may cause serious side effects or your condition may worsen. A special MedGuide will be given to you by the pharmacist with each prescription and refill. Be sure to read this information carefully each time. Talk to your pediatrician regarding the use of this medicine in children. Special care may be needed. Overdosage: If you think you have taken too much of this medicine contact a poison control center or emergency room at once. NOTE: This medicine is  only for you. Do not share this medicine with others. What if I miss a dose? If you miss a dose, take it as soon as you can. If it is less than four hours to your next dose, take only that dose and skip the missed dose. Do not take double or extra doses. What may interact with this medicine? Do not take this  medicine with any of the following medications: -linezolid -MAOIs like Azilect, Carbex, Eldepryl, Marplan, Nardil, and Parnate -methylene blue (injected into a vein) -other medicines that contain bupropion like Zyban This medicine may also interact with the following medications: -alcohol -certain medicines for anxiety or sleep -certain medicines for blood pressure like metoprolol, propranolol -certain medicines for depression or psychotic disturbances -certain medicines for HIV or AIDS like efavirenz, lopinavir, nelfinavir, ritonavir -certain medicines for irregular heart beat like propafenone, flecainide -certain medicines for Parkinson's disease like amantadine, levodopa -certain medicines for seizures like carbamazepine, phenytoin, phenobarbital -cimetidine -clopidogrel -cyclophosphamide -digoxin -furazolidone -isoniazid -nicotine -orphenadrine -procarbazine -steroid medicines like prednisone or cortisone -stimulant medicines for attention disorders, weight loss, or to stay awake -tamoxifen -theophylline -thiotepa -ticlopidine -tramadol -warfarin This list may not describe all possible interactions. Give your health care provider a list of all the medicines, herbs, non-prescription drugs, or dietary supplements you use. Also tell them if you smoke, drink alcohol, or use illegal drugs. Some items may interact with your medicine. What should I watch for while using this medicine? Tell your doctor if your symptoms do not get better or if they get worse. Visit your doctor or health care professional for regular checks on your progress. Because it may take several weeks to see the full effects of this medicine, it is important to continue your treatment as prescribed by your doctor. Patients and their families should watch out for new or worsening thoughts of suicide or depression. Also watch out for sudden changes in feelings such as feeling anxious, agitated, panicky, irritable,  hostile, aggressive, impulsive, severely restless, overly excited and hyperactive, or not being able to sleep. If this happens, especially at the beginning of treatment or after a change in dose, call your health care professional. Avoid alcoholic drinks while taking this medicine. Drinking excessive alcoholic beverages, using sleeping or anxiety medicines, or quickly stopping the use of these agents while taking this medicine may increase your risk for a seizure. Do not drive or use heavy machinery until you know how this medicine affects you. This medicine can impair your ability to perform these tasks. Do not take this medicine close to bedtime. It may prevent you from sleeping. Your mouth may get dry. Chewing sugarless gum or sucking hard candy, and drinking plenty of water may help. Contact your doctor if the problem does not go away or is severe. What side effects may I notice from receiving this medicine? Side effects that you should report to your doctor or health care professional as soon as possible: -allergic reactions like skin rash, itching or hives, swelling of the face, lips, or tongue -breathing problems -changes in vision -confusion -elevated mood, decreased need for sleep, racing thoughts, impulsive behavior -fast or irregular heartbeat -hallucinations, loss of contact with reality -increased blood pressure -redness, blistering, peeling or loosening of the skin, including inside the mouth -seizures -suicidal thoughts or other mood changes -unusually weak or tired -vomiting Side effects that usually do not require medical attention (report to your doctor or health care professional if they continue or are bothersome): -constipation -headache -loss of appetite -  nausea -tremors -weight loss This list may not describe all possible side effects. Call your doctor for medical advice about side effects. You may report side effects to FDA at 1-800-FDA-1088. Where should I keep my  medicine? Keep out of the reach of children. Store at room temperature between 20 and 25 degrees C (68 and 77 degrees F), away from direct sunlight and moisture. Keep tightly closed. Throw away any unused medicine after the expiration date. NOTE: This sheet is a summary. It may not cover all possible information. If you have questions about this medicine, talk to your doctor, pharmacist, or health care provider.  2018 Elsevier/Gold Standard (2015-12-09 13:44:21)

## 2018-05-15 NOTE — Progress Notes (Signed)
Nerve block w/o steroid: Pt signed consent  0.5% Bupivocaine 12 mL LOT: [id number] EXP: 12/01/2019 NDC: [id number]  2% Lidocaine 12 mL LOT: [id number] EXP: 01/31/2019 NDC: [id number]

## 2018-05-16 ENCOUNTER — Ambulatory Visit (INDEPENDENT_AMBULATORY_CARE_PROVIDER_SITE_OTHER): Payer: 59 | Admitting: Neurology

## 2018-05-16 VITALS — BP 114/86 | HR 59

## 2018-05-16 DIAGNOSIS — G43709 Chronic migraine without aura, not intractable, without status migrainosus: Secondary | ICD-10-CM

## 2018-05-16 MED ORDER — DIHYDROERGOTAMINE MESYLATE 1 MG/ML IJ SOLN
1.0000 mg | Freq: Once | INTRAMUSCULAR | Status: AC
  Administered 2018-05-16: 1 mg via INTRAMUSCULAR

## 2018-05-16 NOTE — Progress Notes (Signed)
Interval history: Tremendous improvement, much greater than 50% decrease in migraine frequency and severity and duration. NO JAW MUSCLES. Not eyes. +temples. Do 2 more each side in the c2-c4 paraspinals bilats. +LS. Also at the emergence of the greater occipital nerve. She did not tolerate nerve blocks. Gave DHE today for ongoing migraine.   (She has not gone to dry needling because insurance reasons)Request Jacki Cones on [address] for Dry needling for myofascial cervical and thoracic pain contributing to pain, headaches, decreased range of motion. Failed conservative therapy for over 6 months.   Consent Form Botulism Toxin Injection For Chronic Migraine  Botulism toxin has been approved by the Federal drug administration for treatment of chronic migraine. Botulism toxin does not cure chronic migraine and it may not be effective in some patients.  The administration of botulism toxin is accomplished by injecting a small amount of toxin into the muscles of the neck and head. Dosage must be titrated for each individual. Any benefits resulting from botulism toxin tend to wear off after 3 months with a repeat injection required if benefit is to be maintained. Injections are usually done every 3-4 months with maximum effect peak achieved by about 2 or 3 weeks. Botulism toxin is expensive and you should be sure of what costs you will incur resulting from the injection.  The side effects of botulism toxin use for chronic migraine may include:   -Transient, and usually mild, facial weakness with facial injections  -Transient, and usually mild, head or neck weakness with head/neck injections  -Reduction or loss of forehead facial animation due to forehead muscle              weakness  -Eyelid drooping  -Dry eye  -Pain at the site of injection or bruising at the site of injection  -Double vision  -Potential unknown long term risks  Contraindications: You should not have Botox if you are pregnant,  nursing, allergic to albumin, have an infection, skin condition, or muscle weakness at the site of the injection, or have myasthenia gravis, Lambert-Eaton syndrome, or ALS.  It is also possible that as with any injection, there may be an allergic reaction or no effect from the medication. Reduced effectiveness after repeated injections is sometimes seen and rarely infection at the injection site may occur. All care will be taken to prevent these side effects. If therapy is given over a long time, atrophy and wasting in the muscle injected may occur. Occasionally the patient's become refractory to treatment because they develop antibodies to the toxin. In this event, therapy needs to be modified.  I have read the above information and consent to the administration of botulism toxin.    ______________  _____   _________________  Patient signature     Date   Witness signature       BOTOX PROCEDURE NOTE FOR MIGRAINE HEADACHE    Contraindications and precautions discussed with patient(above). Aseptic procedure was observed and patient tolerated procedure. Procedure performed by Dr. Artemio Aly  The condition has existed for more than 6 months, and pt does not have a diagnosis of ALS, Myasthenia Gravis or Lambert-Eaton Syndrome. Risks and benefits of injections discussed and pt agrees to proceed with the procedure. Written consent obtained  These injections are medically necessary. He receives good benefits from these injections. These injections do not cause sedations or hallucinations which the oral therapies may cause.  Indication/Diagnosis: chronic migraine BOTOX(J0585) injection was performed according to protocol by [employer]. 200 units of  BOTOX was dissolved into 4 cc NS.  NDC: [id number]  Description of procedure:  The patient was placed in a sitting position. The standard protocol was used for Botox as follows, with 5 units of Botox injected at each site:   -Procerus muscle,  midline injection  -Corrugator muscle, bilateral injection  -Frontalis muscle, bilateral injection, with 2 sites each side, medial injection was performed in the upper one third of the frontalis muscle, in the region vertical from the medial inferior edge of the superior orbital rim. The lateral injection was again in the upper one third of the forehead vertically above the lateral limbus of the cornea, 1.5 cm lateral to the medial injection site.  -Temporalis muscle injection, 4 sites, bilaterally. The first injection was 3 cm above the tragus of the ear, second injection site was 1.5 cm to 3 cm up from the first injection site in line with the tragus of the ear. The third injection site was 1.5-3 cm forward between the first 2 injection sites. The fourth injection site was 1.5 cm posterior to the second injection site.  -Occipitalis muscle injection, 3 sites, bilaterally. The first injection was done one half way between the occipital protuberance and the tip of the mastoid process behind the ear. The second injection site was done lateral and superior to the first, 1 fingerbreadth from the first injection. The third injection site was 1 fingerbreadth superiorly and medially from the first injection site.  -Cervical paraspinal muscle injection, 2 sites, bilateral knee first injection site was 1 cm from the midline of the cervical spine, 3 cm inferior to the lower border of the occipital protuberance. The second injection site was 1.5 cm superiorly and laterally to the first injection site.  -Trapezius muscle injection was performed at 3 sites, bilaterally. The first injection site was in the upper trapezius muscle halfway between the inflection point of the neck, and the acromion. The second injection site was one half way between the acromion and the first injection site. The third injection was done between the first injection site and the inflection point of the neck.   Will return for repeat  injection in 3 months.   A 200 unit sof Botox was used, 155 units were injected, the rest of the Botox was wasted. The patient tolerated the procedure well, there were no complications of the above procedure.

## 2018-05-16 NOTE — Progress Notes (Signed)
Botox- 100 units x 2 vials Lot: [id number] Expiration: 09/2020 NDC: [id number]  Bacteriostatic 0.9% Sodium Chloride- 4mL total Lot: [id number] Expiration: 04/02/2019 NDC: [id number]  Dx: I69.629G43.709 S/P   DHE given per MD order. See MAR.

## 2018-05-30 ENCOUNTER — Other Ambulatory Visit: Payer: Self-pay | Admitting: Neurology

## 2018-06-10 ENCOUNTER — Other Ambulatory Visit: Payer: Self-pay | Admitting: *Deleted

## 2018-06-10 MED ORDER — BUPROPION HCL ER (SR) 150 MG PO TB12: 150.0000 mg | ORAL_TABLET | Freq: Two times a day (BID) | ORAL | 3 refills | Status: DC

## 2018-06-23 NOTE — Telephone Encounter (Signed)
This does not seem like a botox charge. I called Morrie Sheldonshley and explained this to her. Offered to send her over to billing but she stated that she would call back.

## 2018-08-13 ENCOUNTER — Telehealth: Payer: Self-pay | Admitting: Neurology

## 2018-08-13 NOTE — Telephone Encounter (Signed)
Cheryl/[facility] [phone] set delivery for botox on 2/18.

## 2018-08-14 NOTE — Telephone Encounter (Signed)
Noted, thank you

## 2018-08-22 ENCOUNTER — Ambulatory Visit: Payer: 59 | Admitting: Neurology

## 2018-08-24 ENCOUNTER — Other Ambulatory Visit: Payer: Self-pay | Admitting: Neurology

## 2018-08-27 ENCOUNTER — Ambulatory Visit: Payer: 59 | Admitting: Neurology

## 2018-08-27 DIAGNOSIS — G43709 Chronic migraine without aura, not intractable, without status migrainosus: Secondary | ICD-10-CM | POA: Diagnosis not present

## 2018-08-27 DIAGNOSIS — R4701 Aphasia: Secondary | ICD-10-CM

## 2018-08-27 DIAGNOSIS — H9190 Unspecified hearing loss, unspecified ear: Secondary | ICD-10-CM

## 2018-08-27 DIAGNOSIS — H814 Vertigo of central origin: Secondary | ICD-10-CM

## 2018-08-27 DIAGNOSIS — H547 Unspecified visual loss: Secondary | ICD-10-CM

## 2018-08-27 DIAGNOSIS — R51 Headache with orthostatic component, not elsewhere classified: Secondary | ICD-10-CM

## 2018-08-27 DIAGNOSIS — R519 Headache, unspecified: Secondary | ICD-10-CM

## 2018-08-27 NOTE — Progress Notes (Signed)
Interval history: Tremendous improvement, much greater than 50% decrease in migraine frequency and severity and duration. NO JAW MUSCLES. Not eyes. . Do 1 more each side in the c2-c4 paraspinals bilats. NO LS. Also at the emergence of the greater occipital nerve. She did not tolerate nerve blocks. For forehead did 3 rows [id number] (last row center above procerus), also lateral corrugator 1 and lateral eye brow 1. She had neck stiffness so only did 3 each trap and not LS. Limited cervical.   Patient reports episodes of aphasia, vertigo, episodic bilateral vision loss, hearing changes and morning/positional headaches. These happen with her migraines however she has had aphasia without headache. May be migraine aura but needs MRI of the brain to evaluate for stroke, schwannoma, space occupying lesion, idiopathic intracranial hypertension or other intracranial etiology.  Orders Placed This Encounter  Procedures  . MR BRAIN W WO CONTRAST     (She has not gone to dry needling because insurance reasons)Request [facility]i Cones on [address] for Dry needling for myofascial cervical and thoracic pain contributing to pain, headaches, decreased range of motion. Failed conservative therapy for over 6 months.     Consent Form Botulism Toxin Injection For Chronic Migraine    Reviewed orally with patient, additionally signature is on file:  Botulism toxin has been approved by the Federal drug administration for treatment of chronic migraine. Botulism toxin does not cure chronic migraine and it may not be effective in some patients.  The administration of botulism toxin is accomplished by injecting a small amount of toxin into the muscles of the neck and head. Dosage must be titrated for each individual. Any benefits resulting from botulism toxin tend to wear off after 3 months with a repeat injection required if benefit is to be maintained. Injections are usually done every 3-4 months with maximum effect peak  achieved by about 2 or 3 weeks. Botulism toxin is expensive and you should be sure of what costs you will incur resulting from the injection.  The side effects of botulism toxin use for chronic migraine may include:   -Transient, and usually mild, facial weakness with facial injections  -Transient, and usually mild, head or neck weakness with head/neck injections  -Reduction or loss of forehead facial animation due to forehead muscle weakness  -Eyelid drooping  -Dry eye  -Pain at the site of injection or bruising at the site of injection  -Double vision  -Potential unknown long term risks  Contraindications: You should not have Botox if you are pregnant, nursing, allergic to albumin, have an infection, skin condition, or muscle weakness at the site of the injection, or have myasthenia gravis, Lambert-Eaton syndrome, or ALS.  It is also possible that as with any injection, there may be an allergic reaction or no effect from the medication. Reduced effectiveness after repeated injections is sometimes seen and rarely infection at the injection site may occur. All care will be taken to prevent these side effects. If therapy is given over a long time, atrophy and wasting in the muscle injected may occur. Occasionally the patient's become refractory to treatment because they develop antibodies to the toxin. In this event, therapy needs to be modified.  I have read the above information and consent to the administration of botulism toxin.    BOTOX PROCEDURE NOTE FOR MIGRAINE HEADACHE    Contraindications and precautions discussed with patient(above). Aseptic procedure was observed and patient tolerated procedure. Procedure performed by Dr. Artemio Aly  The condition has existed for more than  6 months, and pt does not have a diagnosis of ALS, Myasthenia Gravis or Lambert-Eaton Syndrome.  Risks and benefits of injections discussed and pt agrees to proceed with the procedure.  Written consent  obtained  These injections are medically necessary. Pt  receives good benefits from these injections. These injections do not cause sedations or hallucinations which the oral therapies may cause.  Description of procedure:  The patient was placed in a sitting position. The standard protocol was used for Botox as follows, with 5 units of Botox injected at each site:   -Procerus muscle, midline injection  -Corrugator muscle, bilateral injection  - Lateral Corrugator: 1 unit bilaterally  -Frontalis muscle, bilateral injection, with 2 sites each side, medial injection was performed in the upper one third of the frontalis muscle, in the region vertical from the medial inferior edge of the superior orbital rim. The lateral injection was again in the upper one third of the forehead vertically above the lateral limbus of the cornea, 1.5 cm lateral to the medial injection site. Additional 10 units in the forehead as patient's headache start in the forehead and eyes.  -Temporalis muscle injection, 5 sites, bilaterally. The first injection was 3 cm above the tragus of the ear, second injection site was 1.5 cm to 3 cm up from the first injection site in line with the tragus of the ear. The third injection site was 1.5-3 cm forward between the first 2 injection sites. The fourth injection site was 1.5 cm posterior to the second injection site.   - Patient feels the migraines are centered around the eyes +1 units bilaterally at the outer upper quadrant of the orbicularis occuli  -Occipitalis muscle injection, 3 sites, bilaterally. The first injection was done one half way between the occipital protuberance and the tip of the mastoid process behind the ear. The second injection site was done lateral and superior to the first, 1 fingerbreadth from the first injection. The third injection site was 1 fingerbreadth superiorly and medially from the first injection site.  -Cervical paraspinal muscle injection, 2  sites, bilateral knee first injection site was 1 cm from the midline of the cervical spine, 3 cm inferior to the lower border of the occipital protuberance. The second injection site was 1.5 cm superiorly and laterally to the first injection site. Additional 1 unit bilaterally.   -Trapezius muscle injection was performed at 3 sites, bilaterally. The first injection site was in the upper trapezius muscle halfway between the inflection point of the neck, and the acromion. The second injection site was one half way between the acromion and the first injection site. The third injection was done between the first injection site and the inflection point of the neck.   Will return for repeat injection in 3 months.   A 200 unit sof Botox was used, any Botox not injected was wasted. The patient tolerated the procedure well, there were no complications of the above procedure.

## 2018-08-27 NOTE — Progress Notes (Signed)
Botox- 100 units x 2 vials Lot: [id number] Expiration: 11/2020 NDC: 0023-1145-01  Bacteriostatic 0.9% Sodium Chloride- 4mL total Lot: [id number] Expiration: 04/02/2019 NDC: 0409-1966-02  Dx: G43.709 S/P   

## 2018-08-28 ENCOUNTER — Telehealth: Payer: Self-pay | Admitting: Neurology

## 2018-08-28 NOTE — Telephone Encounter (Signed)
lvm for to to call back to schedule mri- it has to be after 10 days b/c of botox  [employer] auth: [employer] via [employer]

## 2018-09-03 NOTE — Telephone Encounter (Signed)
Patient called me back due to the cost of the MRI she is going to have to figure something out and get back to me. I did offer a payment plan.

## 2018-12-01 ENCOUNTER — Telehealth: Payer: Self-pay | Admitting: Neurology

## 2018-12-01 NOTE — Telephone Encounter (Signed)
Pt called wanting to reschedule her BOTOX appt.

## 2018-12-03 NOTE — Telephone Encounter (Signed)
I called and scheduled the patient.  °

## 2018-12-09 NOTE — Telephone Encounter (Signed)
I called and scheduled delivery for the patients Botox for tomorrow 12/10/18. DW

## 2018-12-17 ENCOUNTER — Other Ambulatory Visit: Payer: Self-pay

## 2018-12-17 ENCOUNTER — Ambulatory Visit (INDEPENDENT_AMBULATORY_CARE_PROVIDER_SITE_OTHER): Payer: 59 | Admitting: Neurology

## 2018-12-17 VITALS — Temp 98.1°F

## 2018-12-17 DIAGNOSIS — G43709 Chronic migraine without aura, not intractable, without status migrainosus: Secondary | ICD-10-CM | POA: Diagnosis not present

## 2018-12-17 MED ORDER — BUPROPION HCL ER (SR) 150 MG PO TB12: 150.0000 mg | ORAL_TABLET | Freq: Two times a day (BID) | ORAL | 3 refills | Status: DC

## 2018-12-17 MED ORDER — ATENOLOL 100 MG PO TABS: 100.0000 mg | ORAL_TABLET | Freq: Every day | ORAL | 4 refills | Status: DC

## 2018-12-17 MED ORDER — TIZANIDINE HCL 4 MG PO TABS: 4.0000 mg | ORAL_TABLET | Freq: Four times a day (QID) | ORAL | 3 refills | Status: DC | PRN

## 2018-12-17 MED ORDER — RIZATRIPTAN BENZOATE 10 MG PO TBDP: 10.0000 mg | ORAL_TABLET | ORAL | 11 refills | Status: DC | PRN

## 2018-12-17 MED ORDER — ONDANSETRON 4 MG PO TBDP: 4.0000 mg | ORAL_TABLET | Freq: Three times a day (TID) | ORAL | 6 refills | Status: DC | PRN

## 2018-12-17 NOTE — Progress Notes (Signed)
Botox- 100 units x 2 vials Lot: [id number] Expiration: 08/2021 NDC: 1364-3837-79  Bacteriostatic 0.9% Sodium Chloride- 60mL total Lot: [id number] Expiration: 04/02/2019 NDC: 4847-2072-18  Dx: C88.337 S/P

## 2018-12-17 NOTE — Progress Notes (Signed)
Interval history 12/17/2018: Tremendous improvement, much greater than 50% decrease in migraine frequency and severity and duration. NO JAW MUSCLES. Not eyes. . Do 1 more each side in the c2-c4 paraspinals bilats. NO LS. Also at the emergence of the greater occipital nerve. She did not tolerate nerve blocks. For forehead did 3 rows [phone] (last row center above procerus), also lateral corrugator 1 and lateral eye brow 1. She had neck stiffness so only did 3 each trap and not LS. Limited cervical.    (She has not gone to dry needling because insurance reasons)Request Jacki ConesLaurie on [address] for Dry needling for myofascial cervical and thoracic pain contributing to pain, headaches, decreased range of motion. Failed conservative therapy for over 6 months. Consider in the future.     Consent Form Botulism Toxin Injection For Chronic Migraine    Reviewed orally with patient, additionally signature is on file:  Botulism toxin has been approved by the Federal drug administration for treatment of chronic migraine. Botulism toxin does not cure chronic migraine and it may not be effective in some patients.  The administration of botulism toxin is accomplished by injecting a small amount of toxin into the muscles of the neck and head. Dosage must be titrated for each individual. Any benefits resulting from botulism toxin tend to wear off after 3 months with a repeat injection required if benefit is to be maintained. Injections are usually done every 3-4 months with maximum effect peak achieved by about 2 or 3 weeks. Botulism toxin is expensive and you should be sure of what costs you will incur resulting from the injection.  The side effects of botulism toxin use for chronic migraine may include:   -Transient, and usually mild, facial weakness with facial injections  -Transient, and usually mild, head or neck weakness with head/neck injections  -Reduction or loss of forehead facial animation due to  forehead muscle weakness  -Eyelid drooping  -Dry eye  -Pain at the site of injection or bruising at the site of injection  -Double vision  -Potential unknown long term risks  Contraindications: You should not have Botox if you are pregnant, nursing, allergic to albumin, have an infection, skin condition, or muscle weakness at the site of the injection, or have myasthenia gravis, Lambert-Eaton syndrome, or ALS.  It is also possible that as with any injection, there may be an allergic reaction or no effect from the medication. Reduced effectiveness after repeated injections is sometimes seen and rarely infection at the injection site may occur. All care will be taken to prevent these side effects. If therapy is given over a long time, atrophy and wasting in the muscle injected may occur. Occasionally the patient's become refractory to treatment because they develop antibodies to the toxin. In this event, therapy needs to be modified.  I have read the above information and consent to the administration of botulism toxin.    BOTOX PROCEDURE NOTE FOR MIGRAINE HEADACHE    Contraindications and precautions discussed with patient(above). Aseptic procedure was observed and patient tolerated procedure. Procedure performed by Dr. Artemio Alyoni Trena Dunavan  The condition has existed for more than 6 months, and pt does not have a diagnosis of ALS, Myasthenia Gravis or Lambert-Eaton Syndrome.  Risks and benefits of injections discussed and pt agrees to proceed with the procedure.  Written consent obtained  These injections are medically necessary. Pt  receives good benefits from these injections. These injections do not cause sedations or hallucinations which the oral therapies may cause.  Description of procedure:  The patient was placed in a sitting position. The standard protocol was used for Botox as follows, with 5 units of Botox injected at each site:   -Procerus muscle, midline injection  -Corrugator  muscle, bilateral injection  - Lateral Corrugator: 1 unit bilaterally  -Frontalis muscle, bilateral injection, with 2 sites each side, medial injection was performed in the upper one third of the frontalis muscle, in the region vertical from the medial inferior edge of the superior orbital rim. The lateral injection was again in the upper one third of the forehead vertically above the lateral limbus of the cornea, 1.5 cm lateral to the medial injection site. Additional 10 units in the forehead as patient's headache start in the forehead and eyes.  -Temporalis muscle injection, 5 sites, bilaterally. The first injection was 3 cm above the tragus of the ear, second injection site was 1.5 cm to 3 cm up from the first injection site in line with the tragus of the ear. The third injection site was 1.5-3 cm forward between the first 2 injection sites. The fourth injection site was 1.5 cm posterior to the second injection site.   - Patient feels the migraines are centered around the eyes +1 units bilaterally at the outer upper quadrant of the orbicularis occuli  -Occipitalis muscle injection, 3 sites, bilaterally. The first injection was done one half way between the occipital protuberance and the tip of the mastoid process behind the ear. The second injection site was done lateral and superior to the first, 1 fingerbreadth from the first injection. The third injection site was 1 fingerbreadth superiorly and medially from the first injection site.  -Cervical paraspinal muscle injection, 2 sites, bilateral knee first injection site was 1 cm from the midline of the cervical spine, 3 cm inferior to the lower border of the occipital protuberance. The second injection site was 1.5 cm superiorly and laterally to the first injection site. Additional 1 unit bilaterally.   -Trapezius muscle injection was performed at 3 sites, bilaterally. The first injection site was in the upper trapezius muscle halfway between the  inflection point of the neck, and the acromion. The second injection site was one half way between the acromion and the first injection site. The third injection was done between the first injection site and the inflection point of the neck.   Will return for repeat injection in 3 months.   A 200 unit sof Botox was used, any Botox not injected was wasted. The patient tolerated the procedure well, there were no complications of the above procedure.

## 2019-01-07 ENCOUNTER — Other Ambulatory Visit: Payer: Self-pay | Admitting: Neurology

## 2019-03-16 ENCOUNTER — Other Ambulatory Visit: Payer: Self-pay | Admitting: Neurology

## 2019-03-21 ENCOUNTER — Other Ambulatory Visit: Payer: Self-pay | Admitting: Neurology

## 2019-03-25 ENCOUNTER — Telehealth: Payer: Self-pay

## 2019-03-25 NOTE — Telephone Encounter (Signed)
I called the PA department to complete PA and was told that the patients insurance had termed. I called the patient and she stated that it was the same policy as her last injection. I will call the insurance back and see if there was a mistake on their end.

## 2019-03-30 NOTE — Telephone Encounter (Signed)
I called and completed PA with [facility]. DW

## 2019-04-01 ENCOUNTER — Ambulatory Visit: Payer: 59 | Admitting: Neurology

## 2019-04-01 ENCOUNTER — Other Ambulatory Visit: Payer: Self-pay

## 2019-04-01 ENCOUNTER — Encounter: Payer: Self-pay | Admitting: *Deleted

## 2019-04-01 VITALS — Temp 98.0°F

## 2019-04-01 DIAGNOSIS — G43709 Chronic migraine without aura, not intractable, without status migrainosus: Secondary | ICD-10-CM

## 2019-04-01 NOTE — Progress Notes (Signed)
Botox- 200 units x 1 vial Lot: [id number] Expiration: 10/2021 NDC: 1146-4314-27  Bacteriostatic 0.9% Sodium Chloride- 66mL total Lot: [id number] Expiration: 04/02/2019 NDC: 0349-6116-43  Dx: D39.122 S/P

## 2019-04-01 NOTE — Progress Notes (Signed)
Interval history 04/01/2019: Tremendous improvement, much greater than 60% decrease in migraine frequency and severity and duration.See notes in email.    Consent Form Botulism Toxin Injection For Chronic Migraine    Reviewed orally with patient, additionally signature is on file:  Botulism toxin has been approved by the Federal drug administration for treatment of chronic migraine. Botulism toxin does not cure chronic migraine and it may not be effective in some patients.  The administration of botulism toxin is accomplished by injecting a small amount of toxin into the muscles of the neck and head. Dosage must be titrated for each individual. Any benefits resulting from botulism toxin tend to wear off after 3 months with a repeat injection required if benefit is to be maintained. Injections are usually done every 3-4 months with maximum effect peak achieved by about 2 or 3 weeks. Botulism toxin is expensive and you should be sure of what costs you will incur resulting from the injection.  The side effects of botulism toxin use for chronic migraine may include:   -Transient, and usually mild, facial weakness with facial injections  -Transient, and usually mild, head or neck weakness with head/neck injections  -Reduction or loss of forehead facial animation due to forehead muscle weakness  -Eyelid drooping  -Dry eye  -Pain at the site of injection or bruising at the site of injection  -Double vision  -Potential unknown long term risks  Contraindications: You should not have Botox if you are pregnant, nursing, allergic to albumin, have an infection, skin condition, or muscle weakness at the site of the injection, or have myasthenia gravis, Lambert-Eaton syndrome, or ALS.  It is also possible that as with any injection, there may be an allergic reaction or no effect from the medication. Reduced effectiveness after repeated injections is sometimes seen and rarely infection at the injection  site may occur. All care will be taken to prevent these side effects. If therapy is given over a long time, atrophy and wasting in the muscle injected may occur. Occasionally the patient's become refractory to treatment because they develop antibodies to the toxin. In this event, therapy needs to be modified.  I have read the above information and consent to the administration of botulism toxin.    BOTOX PROCEDURE NOTE FOR MIGRAINE HEADACHE    Contraindications and precautions discussed with patient(above). Aseptic procedure was observed and patient tolerated procedure. Procedure performed by Dr. Artemio Aly  The condition has existed for more than 6 months, and pt does not have a diagnosis of ALS, Myasthenia Gravis or Lambert-Eaton Syndrome.  Risks and benefits of injections discussed and pt agrees to proceed with the procedure.  Written consent obtained  These injections are medically necessary. Pt  receives good benefits from these injections. These injections do not cause sedations or hallucinations which the oral therapies may cause.  Description of procedure:  The patient was placed in a sitting position. The standard protocol was used for Botox as follows, with 5 units of Botox injected at each site:   -Procerus muscle, midline injection  -Corrugator muscle, bilateral injection  - Lateral Corrugator: 1 unit bilaterally  -Frontalis muscle, bilateral injection, with 2 sites each side, medial injection was performed in the upper one third of the frontalis muscle, in the region vertical from the medial inferior edge of the superior orbital rim. The lateral injection was again in the upper one third of the forehead vertically above the lateral limbus of the cornea, 1.5 cm lateral to the  medial injection site. Additional 10 units in the forehead as patient's headache start in the forehead and eyes.  -Temporalis muscle injection, 4 sites, bilaterally. The first injection was 3 cm above  the tragus of the ear, second injection site was 1.5 cm to 3 cm up from the first injection site in line with the tragus of the ear. The third injection site was 1.5-3 cm forward between the first 2 injection sites. The fourth injection site was 1.5 cm posterior to the second injection site.   -Occipitalis muscle injection, 3 sites, bilaterally. The first injection was done one half way between the occipital protuberance and the tip of the mastoid process behind the ear. The second injection site was done lateral and superior to the first, 1 fingerbreadth from the first injection. The third injection site was 1 fingerbreadth superiorly and medially from the first injection site.  -Cervical paraspinal muscle injection, 2 sites, bilateral knee first injection site was 1 cm from the midline of the cervical spine, 3 cm inferior to the lower border of the occipital protuberance. The second injection site was 1.5 cm superiorly and laterally to the first injection site. Additional 1 unit bilaterally.   -Trapezius muscle injection was performed at 3 sites, bilaterally. The first injection site was in the upper trapezius muscle halfway between the inflection point of the neck, and the acromion. The second injection site was one half way between the acromion and the first injection site. The third injection was done between the first injection site and the inflection point of the neck.   Will return for repeat injection in 3 months.   A 200 unit sof Botox was used, any Botox not injected was wasted. The patient tolerated the procedure well, there were no complications of the above procedure.

## 2019-04-06 ENCOUNTER — Other Ambulatory Visit: Payer: Self-pay | Admitting: Neurology

## 2019-06-22 DIAGNOSIS — F329 Major depressive disorder, single episode, unspecified: Secondary | ICD-10-CM | POA: Insufficient documentation

## 2019-07-02 ENCOUNTER — Ambulatory Visit: Payer: 59 | Admitting: Neurology

## 2019-07-07 ENCOUNTER — Ambulatory Visit (INDEPENDENT_AMBULATORY_CARE_PROVIDER_SITE_OTHER): Payer: 59 | Admitting: Neurology

## 2019-07-07 ENCOUNTER — Other Ambulatory Visit: Payer: Self-pay

## 2019-07-07 VITALS — Temp 97.5°F

## 2019-07-07 DIAGNOSIS — Z1152 Encounter for screening for COVID-19: Secondary | ICD-10-CM

## 2019-07-07 DIAGNOSIS — G43709 Chronic migraine without aura, not intractable, without status migrainosus: Secondary | ICD-10-CM

## 2019-07-07 NOTE — Progress Notes (Signed)
Botox- 100 units x 2 vials Lot: [id number] Expiration: 01/2022 NDC: 5615-3794-32  Bacteriostatic 0.9% Sodium Chloride- 21mL total Lot: [id number] Expiration: 10/01/2019 NDC: 9295-7473-40  Dx: Z70.964 S/P

## 2019-07-07 NOTE — Progress Notes (Signed)
Interval history 07/07/2019: Tremendous improvement, much greater than 60% decrease in migraine frequency and severity and duration.See notes in email.    Consent Form Botulism Toxin Injection For Chronic Migraine    Reviewed orally with patient, additionally signature is on file:  Botulism toxin has been approved by the Federal drug administration for treatment of chronic migraine. Botulism toxin does not cure chronic migraine and it may not be effective in some patients.  The administration of botulism toxin is accomplished by injecting a small amount of toxin into the muscles of the neck and head. Dosage must be titrated for each individual. Any benefits resulting from botulism toxin tend to wear off after 3 months with a repeat injection required if benefit is to be maintained. Injections are usually done every 3-4 months with maximum effect peak achieved by about 2 or 3 weeks. Botulism toxin is expensive and you should be sure of what costs you will incur resulting from the injection.  The side effects of botulism toxin use for chronic migraine may include:   -Transient, and usually mild, facial weakness with facial injections  -Transient, and usually mild, head or neck weakness with head/neck injections  -Reduction or loss of forehead facial animation due to forehead muscle weakness  -Eyelid drooping  -Dry eye  -Pain at the site of injection or bruising at the site of injection  -Double vision  -Potential unknown long term risks  Contraindications: You should not have Botox if you are pregnant, nursing, allergic to albumin, have an infection, skin condition, or muscle weakness at the site of the injection, or have myasthenia gravis, Lambert-Eaton syndrome, or ALS.  It is also possible that as with any injection, there may be an allergic reaction or no effect from the medication. Reduced effectiveness after repeated injections is sometimes seen and rarely infection at the injection  site may occur. All care will be taken to prevent these side effects. If therapy is given over a long time, atrophy and wasting in the muscle injected may occur. Occasionally the patient's become refractory to treatment because they develop antibodies to the toxin. In this event, therapy needs to be modified.  I have read the above information and consent to the administration of botulism toxin.    BOTOX PROCEDURE NOTE FOR MIGRAINE HEADACHE    Contraindications and precautions discussed with patient(above). Aseptic procedure was observed and patient tolerated procedure. Procedure performed by Dr. Georgia Dom  The condition has existed for more than 6 months, and pt does not have a diagnosis of ALS, Myasthenia Gravis or Lambert-Eaton Syndrome.  Risks and benefits of injections discussed and pt agrees to proceed with the procedure.  Written consent obtained  These injections are medically necessary. Pt  receives good benefits from these injections. These injections do not cause sedations or hallucinations which the oral therapies may cause.  Description of procedure:  The patient was placed in a sitting position. The standard protocol was used for Botox as follows, with 5 units of Botox injected at each site:   -Procerus muscle, midline injection  -Corrugator muscle, bilateral injection  - Lateral Corrugator: 1 unit bilaterally  -Frontalis muscle, bilateral injection, with 2 sites each side, medial injection was performed in the upper one third of the frontalis muscle, in the region vertical from the medial inferior edge of the superior orbital rim. The lateral injection was again in the upper one third of the forehead vertically above the lateral limbus of the cornea, 1.5 cm lateral to the  medial injection site. Additional 10 units in the forehead as patient's headache start in the forehead and eyes.  -Temporalis muscle injection, 4 sites, bilaterally. The first injection was 3 cm above  the tragus of the ear, second injection site was 1.5 cm to 3 cm up from the first injection site in line with the tragus of the ear. The third injection site was 1.5-3 cm forward between the first 2 injection sites. The fourth injection site was 1.5 cm posterior to the second injection site.   -Occipitalis muscle injection, 3 sites, bilaterally. The first injection was done one half way between the occipital protuberance and the tip of the mastoid process behind the ear. The second injection site was done lateral and superior to the first, 1 fingerbreadth from the first injection. The third injection site was 1 fingerbreadth superiorly and medially from the first injection site.  -Cervical paraspinal muscle injection, 2 sites, bilateral knee first injection site was 1 cm from the midline of the cervical spine, 3 cm inferior to the lower border of the occipital protuberance. The second injection site was 1.5 cm superiorly and laterally to the first injection site. Additional 1 unit bilaterally.   -Trapezius muscle injection was performed at 3 sites, bilaterally. The first injection site was in the upper trapezius muscle halfway between the inflection point of the neck, and the acromion. The second injection site was one half way between the acromion and the first injection site. The third injection was done between the first injection site and the inflection point of the neck.   Will return for repeat injection in 3 months.   A 200 unit sof Botox was used, any Botox not injected was wasted. The patient tolerated the procedure well, there were no complications of the above procedure.

## 2019-07-08 LAB — SAR COV2 SEROLOGY (COVID19)AB(IGG),IA: DiaSorin SARS-CoV-2 Ab, IgG: NEGATIVE

## 2019-08-03 ENCOUNTER — Other Ambulatory Visit: Payer: Self-pay

## 2019-08-04 MED ORDER — TIZANIDINE HCL 4 MG PO TABS: 4.0000 mg | ORAL_TABLET | Freq: Four times a day (QID) | ORAL | 0 refills | Status: DC | PRN

## 2019-09-01 ENCOUNTER — Other Ambulatory Visit: Payer: Self-pay | Admitting: Neurology

## 2019-10-06 ENCOUNTER — Ambulatory Visit (INDEPENDENT_AMBULATORY_CARE_PROVIDER_SITE_OTHER): Payer: 59 | Admitting: Neurology

## 2019-10-06 ENCOUNTER — Other Ambulatory Visit: Payer: Self-pay

## 2019-10-06 VITALS — Temp 99.0°F

## 2019-10-06 DIAGNOSIS — G43709 Chronic migraine without aura, not intractable, without status migrainosus: Secondary | ICD-10-CM | POA: Diagnosis not present

## 2019-10-06 MED ORDER — NURTEC 75 MG PO TBDP: 75.0000 mg | ORAL_TABLET | Freq: Every day | ORAL | 0 refills | Status: DC | PRN

## 2019-10-06 NOTE — Progress Notes (Signed)
Interval history 07/07/2019: Tremendous improvement, much greater than 60% decrease in migraine frequency and severity and duration.See notes in email. Baseline was daily headaches and 1 migraine days monthly. Now she can go weeks without even one headache. The migraines she does have are much less severe and easier to treat. She has maybe 1 migraine day a week much improved > 50% improvement in both frequency and severity. Tried ubrelvy not so great. Will give samples of nurtec for acute management, likes zipsor the best.    Consent Form Botulism Toxin Injection For Chronic Migraine    Reviewed orally with patient, additionally signature is on file:  Botulism toxin has been approved by the Federal drug administration for treatment of chronic migraine. Botulism toxin does not cure chronic migraine and it may not be effective in some patients.  The administration of botulism toxin is accomplished by injecting a small amount of toxin into the muscles of the neck and head. Dosage must be titrated for each individual. Any benefits resulting from botulism toxin tend to wear off after 3 months with a repeat injection required if benefit is to be maintained. Injections are usually done every 3-4 months with maximum effect peak achieved by about 2 or 3 weeks. Botulism toxin is expensive and you should be sure of what costs you will incur resulting from the injection.  The side effects of botulism toxin use for chronic migraine may include:   -Transient, and usually mild, facial weakness with facial injections  -Transient, and usually mild, head or neck weakness with head/neck injections  -Reduction or loss of forehead facial animation due to forehead muscle weakness  -Eyelid drooping  -Dry eye  -Pain at the site of injection or bruising at the site of injection  -Double vision  -Potential unknown long term risks  Contraindications: You should not have Botox if you are pregnant, nursing, allergic to  albumin, have an infection, skin condition, or muscle weakness at the site of the injection, or have myasthenia gravis, Lambert-Eaton syndrome, or ALS.  It is also possible that as with any injection, there may be an allergic reaction or no effect from the medication. Reduced effectiveness after repeated injections is sometimes seen and rarely infection at the injection site may occur. All care will be taken to prevent these side effects. If therapy is given over a long time, atrophy and wasting in the muscle injected may occur. Occasionally the patient's become refractory to treatment because they develop antibodies to the toxin. In this event, therapy needs to be modified.  I have read the above information and consent to the administration of botulism toxin.    BOTOX PROCEDURE NOTE FOR MIGRAINE HEADACHE    Contraindications and precautions discussed with patient(above). Aseptic procedure was observed and patient tolerated procedure. Procedure performed by Dr. Artemio Aly  The condition has existed for more than 6 months, and pt does not have a diagnosis of ALS, Myasthenia Gravis or Lambert-Eaton Syndrome.  Risks and benefits of injections discussed and pt agrees to proceed with the procedure.  Written consent obtained  These injections are medically necessary. Pt  receives good benefits from these injections. These injections do not cause sedations or hallucinations which the oral therapies may cause.  Description of procedure:  The patient was placed in a sitting position. The standard protocol was used for Botox as follows, with 5 units of Botox injected at each site:   -Procerus muscle, midline injection  -Corrugator muscle, bilateral injection  - Lateral Corrugator:  1 unit bilaterally  -Frontalis muscle, bilateral injection, with 2 sites each side, medial injection was performed in the upper one third of the frontalis muscle, in the region vertical from the medial inferior edge of  the superior orbital rim. The lateral injection was again in the upper one third of the forehead vertically above the lateral limbus of the cornea, 1.5 cm lateral to the medial injection site. Additional 10 units in the forehead as patient's headache start in the forehead and eyes.  -Temporalis muscle injection, 4 sites, bilaterally. The first injection was 3 cm above the tragus of the ear, second injection site was 1.5 cm to 3 cm up from the first injection site in line with the tragus of the ear. The third injection site was 1.5-3 cm forward between the first 2 injection sites. The fourth injection site was 1.5 cm posterior to the second injection site.   -Occipitalis muscle injection, 3 sites, bilaterally. The first injection was done one half way between the occipital protuberance and the tip of the mastoid process behind the ear. The second injection site was done lateral and superior to the first, 1 fingerbreadth from the first injection. The third injection site was 1 fingerbreadth superiorly and medially from the first injection site.  -Cervical paraspinal muscle injection, 2 sites, bilateral knee first injection site was 1 cm from the midline of the cervical spine, 3 cm inferior to the lower border of the occipital protuberance. The second injection site was 1.5 cm superiorly and laterally to the first injection site. Additional 1 unit bilaterally.   -Trapezius muscle injection was performed at 3 sites, bilaterally. The first injection site was in the upper trapezius muscle halfway between the inflection point of the neck, and the acromion. The second injection site was one half way between the acromion and the first injection site. The third injection was done between the first injection site and the inflection point of the neck.   Will return for repeat injection in 3 months.   A 200 unit sof Botox was used, any Botox not injected was wasted. The patient tolerated the procedure well, there  were no complications of the above procedure.

## 2019-10-06 NOTE — Progress Notes (Signed)
Botox- 100 units x 2 vials Lot: [id number] Expiration: 05/2022 NDC: 9163-8466-59  Bacteriostatic 0.9% Sodium Chloride- 13mL total Lot: [id number] Expiration: 12/31/2019 NDC: 7793-9030-09  Dx: Q33.007 S/P

## 2019-10-08 MED ORDER — CAMBIA 50 MG PO PACK: PACK | ORAL | 11 refills | Status: DC

## 2019-10-08 NOTE — Addendum Note (Signed)
Addended by: Bertram Savin on: 10/08/2019 06:01 PM   Modules accepted: Orders

## 2019-12-06 ENCOUNTER — Other Ambulatory Visit: Payer: Self-pay | Admitting: Neurology

## 2019-12-21 ENCOUNTER — Other Ambulatory Visit: Payer: Self-pay | Admitting: Neurology

## 2019-12-22 ENCOUNTER — Telehealth: Payer: Self-pay | Admitting: Neurology

## 2019-12-22 NOTE — Telephone Encounter (Signed)
Pt states she was told she would hear from Dr Lucia Gaskins re: what pharmacy she would need to use for her Botox since [facility] is not an option for her.  Please call

## 2019-12-22 NOTE — Telephone Encounter (Signed)
I called patient back. I let her know that I was going to be calling [employer] to see if a PA was needed for the Botox, and when I do that I will ask what the preferred pharmacy is. She verbalized understanding.

## 2019-12-22 NOTE — Telephone Encounter (Signed)
I called [employer] [phone] and spoke with Nyla. She states codes [phone] and [phone] are valid and billable and do not require PA. I asked her what the preferred pharmacy is since [facility] no longer fills Botox. She states patient will be B/B. Reference [id number].

## 2019-12-28 ENCOUNTER — Other Ambulatory Visit: Payer: Self-pay | Admitting: Neurology

## 2020-01-05 ENCOUNTER — Other Ambulatory Visit: Payer: Self-pay

## 2020-01-05 ENCOUNTER — Ambulatory Visit: Payer: 59 | Admitting: Neurology

## 2020-01-05 DIAGNOSIS — G43709 Chronic migraine without aura, not intractable, without status migrainosus: Secondary | ICD-10-CM

## 2020-01-05 NOTE — Progress Notes (Signed)
Botox- 200 units x 1 vial Lot: [id number] Expiration: 08/2022 NDC: 0023-3921-02  Bacteriostatic 0.9% Sodium Chloride- 4mL total Lot: [id number] Expiration: 04/01/2021 NDC: 0409-1966-02  Dx: G43.709 B/B  

## 2020-01-05 NOTE — Progress Notes (Signed)
Interval history 01/05/2020: >> 70% improvement migraine frequency, severity and duration. No masseters. See email.  Interval history 07/07/2019: Tremendous improvement, much greater than 60% decrease in migraine frequency and severity and duration.See notes in email. Baseline was daily headaches and 1 migraine days monthly. Now she can go weeks without even one headache. The migraines she does have are much less severe and easier to treat. She has maybe 1 migraine day a week much improved > 50% improvement in both frequency and severity. Tried ubrelvy not so great. Will give samples of nurtec for acute management, likes zipsor the best.    Consent Form Botulism Toxin Injection For Chronic Migraine    Reviewed orally with patient, additionally signature is on file:  Botulism toxin has been approved by the Federal drug administration for treatment of chronic migraine. Botulism toxin does not cure chronic migraine and it may not be effective in some patients.  The administration of botulism toxin is accomplished by injecting a small amount of toxin into the muscles of the neck and head. Dosage must be titrated for each individual. Any benefits resulting from botulism toxin tend to wear off after 3 months with a repeat injection required if benefit is to be maintained. Injections are usually done every 3-4 months with maximum effect peak achieved by about 2 or 3 weeks. Botulism toxin is expensive and you should be sure of what costs you will incur resulting from the injection.  The side effects of botulism toxin use for chronic migraine may include:   -Transient, and usually mild, facial weakness with facial injections  -Transient, and usually mild, head or neck weakness with head/neck injections  -Reduction or loss of forehead facial animation due to forehead muscle weakness  -Eyelid drooping  -Dry eye  -Pain at the site of injection or bruising at the site of injection  -Double  vision  -Potential unknown long term risks  Contraindications: You should not have Botox if you are pregnant, nursing, allergic to albumin, have an infection, skin condition, or muscle weakness at the site of the injection, or have myasthenia gravis, Lambert-Eaton syndrome, or ALS.  It is also possible that as with any injection, there may be an allergic reaction or no effect from the medication. Reduced effectiveness after repeated injections is sometimes seen and rarely infection at the injection site may occur. All care will be taken to prevent these side effects. If therapy is given over a long time, atrophy and wasting in the muscle injected may occur. Occasionally the patient's become refractory to treatment because they develop antibodies to the toxin. In this event, therapy needs to be modified.  I have read the above information and consent to the administration of botulism toxin.    BOTOX PROCEDURE NOTE FOR MIGRAINE HEADACHE    Contraindications and precautions discussed with patient(above). Aseptic procedure was observed and patient tolerated procedure. Procedure performed by Dr. Artemio Aly  The condition has existed for more than 6 months, and pt does not have a diagnosis of ALS, Myasthenia Gravis or Lambert-Eaton Syndrome.  Risks and benefits of injections discussed and pt agrees to proceed with the procedure.  Written consent obtained  These injections are medically necessary. Pt  receives good benefits from these injections. These injections do not cause sedations or hallucinations which the oral therapies may cause.  Description of procedure:  The patient was placed in a sitting position. The standard protocol was used for Botox as follows, with 5 units of Botox injected at each  site:   -Procerus muscle, midline injection  -Corrugator muscle, bilateral injection  - Lateral Corrugator: 1 unit bilaterally  -Frontalis muscle, bilateral injection, with 2 sites each side,  medial injection was performed in the upper one third of the frontalis muscle, in the region vertical from the medial inferior edge of the superior orbital rim. The lateral injection was again in the upper one third of the forehead vertically above the lateral limbus of the cornea, 1.5 cm lateral to the medial injection site. Additional 10 units in the forehead as patient's headache start in the forehead and eyes.  -Temporalis muscle injection, 4 sites, bilaterally. The first injection was 3 cm above the tragus of the ear, second injection site was 1.5 cm to 3 cm up from the first injection site in line with the tragus of the ear. The third injection site was 1.5-3 cm forward between the first 2 injection sites. The fourth injection site was 1.5 cm posterior to the second injection site.   -Occipitalis muscle injection, 3 sites, bilaterally. The first injection was done one half way between the occipital protuberance and the tip of the mastoid process behind the ear. The second injection site was done lateral and superior to the first, 1 fingerbreadth from the first injection. The third injection site was 1 fingerbreadth superiorly and medially from the first injection site.  -Cervical paraspinal muscle injection, 2 sites, bilateral knee first injection site was 1 cm from the midline of the cervical spine, 3 cm inferior to the lower border of the occipital protuberance. The second injection site was 1.5 cm superiorly and laterally to the first injection site. Additional 1 unit bilaterally.   -Trapezius muscle injection was performed at 3 sites, bilaterally. The first injection site was in the upper trapezius muscle halfway between the inflection point of the neck, and the acromion. The second injection site was one half way between the acromion and the first injection site. The third injection was done between the first injection site and the inflection point of the neck.   Will return for repeat  injection in 3 months.   A 200 unit sof Botox was used, any Botox not injected was wasted. The patient tolerated the procedure well, there were no complications of the above procedure.

## 2020-01-12 ENCOUNTER — Ambulatory Visit: Payer: 59 | Admitting: Neurology

## 2020-02-29 ENCOUNTER — Other Ambulatory Visit: Payer: Self-pay | Admitting: Neurology

## 2020-02-29 NOTE — Telephone Encounter (Signed)
Spoke with Dr Lucia Gaskins who provided v.o to change Bupropion 150 mg to once daily as patient takes. Refill for one year.   Rx sent to pharmacy.

## 2020-03-25 ENCOUNTER — Other Ambulatory Visit: Payer: Self-pay | Admitting: Neurology

## 2020-04-18 ENCOUNTER — Telehealth: Payer: Self-pay | Admitting: Neurology

## 2020-04-18 NOTE — Telephone Encounter (Signed)
Patient is scheduled for Botox appointment tomorrow. Her insurance has not paid [place] for the Botox from last visit because she was B/B, and insurance rejects the claim because they state that patient must use [facility]. We have been told by [facility] numerous times that the patient must use [facility] for the Botox (I verified this again today, I called [employer] and spoke with Riley Lam, she states no Berkley Harvey is needed but [facility] Reference #[id number]). [facility] no longer fills Botox for most insurances. I have verified numerous times that [facility] is not able to [facility]. I called them this morning and spoke with Maralyn Sago, who again states that they are unable to fill Botox for this patient. Reference #[id number]. Patient's insurance is unable to provide me with any other specialty pharmacy.   I called the patient and LVM stating that we need to cancel her appointment because of this issue. I directed her to call myself or Angie in billing.

## 2020-04-19 ENCOUNTER — Ambulatory Visit: Payer: 59 | Admitting: Neurology

## 2020-05-09 ENCOUNTER — Ambulatory Visit: Payer: 59 | Admitting: Neurology

## 2020-05-09 ENCOUNTER — Other Ambulatory Visit: Payer: Self-pay

## 2020-05-09 DIAGNOSIS — G43709 Chronic migraine without aura, not intractable, without status migrainosus: Secondary | ICD-10-CM

## 2020-05-09 NOTE — Progress Notes (Signed)
Interval history 05/09/2020: Tremendous improvement, much greater than 70% decrease in migraine frequency and severity and duration.See notes in email. Baseline was daily headaches and 1 migraine days monthly. Now she can go weeks without even one headache. The migraines she does have are much less severe and easier to treat. She has maybe 1 migraine day a week much improved > 50% improvement in both frequency and severity. Tried ubrelvy not so great. Will give samples of nurtec for acute management, likes zipsor the best. NO MASSETERS.     Consent Form Botulism Toxin Injection For Chronic Migraine    Reviewed orally with patient, additionally signature is on file:  Botulism toxin has been approved by the Federal drug administration for treatment of chronic migraine. Botulism toxin does not cure chronic migraine and it may not be effective in some patients.  The administration of botulism toxin is accomplished by injecting a small amount of toxin into the muscles of the neck and head. Dosage must be titrated for each individual. Any benefits resulting from botulism toxin tend to wear off after 3 months with a repeat injection required if benefit is to be maintained. Injections are usually done every 3-4 months with maximum effect peak achieved by about 2 or 3 weeks. Botulism toxin is expensive and you should be sure of what costs you will incur resulting from the injection.  The side effects of botulism toxin use for chronic migraine may include:   -Transient, and usually mild, facial weakness with facial injections  -Transient, and usually mild, head or neck weakness with head/neck injections  -Reduction or loss of forehead facial animation due to forehead muscle weakness  -Eyelid drooping  -Dry eye  -Pain at the site of injection or bruising at the site of injection  -Double vision  -Potential unknown long term risks  Contraindications: You should not have Botox if you are pregnant,  nursing, allergic to albumin, have an infection, skin condition, or muscle weakness at the site of the injection, or have myasthenia gravis, Lambert-Eaton syndrome, or ALS.  It is also possible that as with any injection, there may be an allergic reaction or no effect from the medication. Reduced effectiveness after repeated injections is sometimes seen and rarely infection at the injection site may occur. All care will be taken to prevent these side effects. If therapy is given over a long time, atrophy and wasting in the muscle injected may occur. Occasionally the patient's become refractory to treatment because they develop antibodies to the toxin. In this event, therapy needs to be modified.  I have read the above information and consent to the administration of botulism toxin.    BOTOX PROCEDURE NOTE FOR MIGRAINE HEADACHE    Contraindications and precautions discussed with patient(above). Aseptic procedure was observed and patient tolerated procedure. Procedure performed by Dr. Artemio Aly  The condition has existed for more than 6 months, and pt does not have a diagnosis of ALS, Myasthenia Gravis or Lambert-Eaton Syndrome.  Risks and benefits of injections discussed and pt agrees to proceed with the procedure.  Written consent obtained  These injections are medically necessary. Pt  receives good benefits from these injections. These injections do not cause sedations or hallucinations which the oral therapies may cause.  Description of procedure:  The patient was placed in a sitting position. The standard protocol was used for Botox as follows, with 5 units of Botox injected at each site:   -Procerus muscle, midline injection  -Corrugator muscle, bilateral injection  -  Lateral Corrugator: 1 unit bilaterally  -Frontalis muscle, bilateral injection, with 2 sites each side, medial injection was performed in the upper one third of the frontalis muscle, in the region vertical from the  medial inferior edge of the superior orbital rim. The lateral injection was again in the upper one third of the forehead vertically above the lateral limbus of the cornea, 1.5 cm lateral to the medial injection site. Additional 10 units in the forehead as patient's headache start in the forehead and eyes.  -Temporalis muscle injection, 4 sites, bilaterally. The first injection was 3 cm above the tragus of the ear, second injection site was 1.5 cm to 3 cm up from the first injection site in line with the tragus of the ear. The third injection site was 1.5-3 cm forward between the first 2 injection sites. The fourth injection site was 1.5 cm posterior to the second injection site.   -Occipitalis muscle injection, 3 sites, bilaterally. The first injection was done one half way between the occipital protuberance and the tip of the mastoid process behind the ear. The second injection site was done lateral and superior to the first, 1 fingerbreadth from the first injection. The third injection site was 1 fingerbreadth superiorly and medially from the first injection site.  -Cervical paraspinal muscle injection, 2 sites, bilateral knee first injection site was 1 cm from the midline of the cervical spine, 3 cm inferior to the lower border of the occipital protuberance. The second injection site was 1.5 cm superiorly and laterally to the first injection site. Additional 1 unit bilaterally.   -Trapezius muscle injection was performed at 3 sites, bilaterally. The first injection site was in the upper trapezius muscle halfway between the inflection point of the neck, and the acromion. The second injection site was one half way between the acromion and the first injection site. The third injection was done between the first injection site and the inflection point of the neck.   Will return for repeat injection in 3 months.   A 200 unit sof Botox was used, any Botox not injected was wasted. The patient tolerated the  procedure well, there were no complications of the above procedure.  I spent 30 minutes of face-to-face and non-face-to-face time with patient on the  1. Chronic migraine without aura without status migrainosus, not intractable    diagnosis.  This included previsit chart review, lab review, study review, order entry, electronic health record documentation, patient education on the different diagnostic and therapeutic options, counseling and coordination of care, risks and benefits of management, compliance, or risk factor reduction

## 2020-05-09 NOTE — Progress Notes (Signed)
Botox- 200 units x 1 vial Lot: [id number] Expiration: 08/2022 NDC: 6244-6950-72  Bacteriostatic 0.9% Sodium Chloride- 70mL total Lot: [id number] Expiration: 08/02/2021 NDC: 1833-5825-18  Dx: F84.210 sample

## 2020-07-04 ENCOUNTER — Other Ambulatory Visit: Payer: Self-pay | Admitting: Neurology

## 2020-07-18 ENCOUNTER — Telehealth: Payer: Self-pay | Admitting: Neurology

## 2020-07-18 NOTE — Telephone Encounter (Signed)
Patient has a Botox appointment 1/18. I called [employer] and spoke with Thurston Hole to check PA requirements of [phone] and [phone]. Thurston Hole states no PA is required, but Botox needs to come from [place]. Reference #[id number]. I have submitted PA request for pharmacy benefits to [facility] via [employer]. Waiting for response.

## 2020-07-19 ENCOUNTER — Ambulatory Visit: Payer: Self-pay | Admitting: Neurology

## 2020-07-25 NOTE — Telephone Encounter (Signed)
I called the patient and explained the specialty pharmacy enrollment process to her. Patient is already enrolled in Botox Savings Program. Patient is aware that we will send her Botox co-pay card information to [facility] with enrollment form. Advised patient that I will call her when [facility] is ready to schedule Botox delivery.

## 2020-07-25 NOTE — Telephone Encounter (Signed)
Received approval from [facility]. PA #[id number] (07/18/20- 07/18/21). I filled out enrollment form for [facility] and included prescription, insurance card, patient demographics & [facility] approval letter. Will give to MD to sign.

## 2020-07-26 NOTE — Telephone Encounter (Signed)
Faxed enrollment form to [facility].

## 2020-07-28 NOTE — Telephone Encounter (Signed)
Received a fax from [facility] stating that they need a copy of most recent insurance card.

## 2020-08-01 NOTE — Telephone Encounter (Signed)
I called [facility] and spoke with [facility] to check status. Lae states Botox TBD 2/3.

## 2020-08-04 NOTE — Telephone Encounter (Signed)
Received (1) 200U vial of Botox today from [facility]. 

## 2020-08-09 ENCOUNTER — Ambulatory Visit: Payer: Self-pay | Admitting: Neurology

## 2020-08-23 ENCOUNTER — Ambulatory Visit: Payer: 59 | Admitting: Neurology

## 2020-08-23 DIAGNOSIS — G43709 Chronic migraine without aura, not intractable, without status migrainosus: Secondary | ICD-10-CM | POA: Diagnosis not present

## 2020-08-23 NOTE — Progress Notes (Signed)
Interval history 05/09/2020: Tremendous improvement, much greater than 70% decrease in migraine frequency and severity and duration.See notes in email. Baseline was daily headaches and 1 migraine days monthly. Now she can go weeks without even one headache. The migraines she does have are much less severe and easier to treat. She has maybe 1 migraine day a week much improved > 50% improvement in both frequency and severity. Tried ubrelvy not so great. Will give samples of nurtec for acute management, likes zipsor the best. NO MASSETERS.     Consent Form Botulism Toxin Injection For Chronic Migraine    Reviewed orally with patient, additionally signature is on file:  Botulism toxin has been approved by the Federal drug administration for treatment of chronic migraine. Botulism toxin does not cure chronic migraine and it may not be effective in some patients.  The administration of botulism toxin is accomplished by injecting a small amount of toxin into the muscles of the neck and head. Dosage must be titrated for each individual. Any benefits resulting from botulism toxin tend to wear off after 3 months with a repeat injection required if benefit is to be maintained. Injections are usually done every 3-4 months with maximum effect peak achieved by about 2 or 3 weeks. Botulism toxin is expensive and you should be sure of what costs you will incur resulting from the injection.  The side effects of botulism toxin use for chronic migraine may include:   -Transient, and usually mild, facial weakness with facial injections  -Transient, and usually mild, head or neck weakness with head/neck injections  -Reduction or loss of forehead facial animation due to forehead muscle weakness  -Eyelid drooping  -Dry eye  -Pain at the site of injection or bruising at the site of injection  -Double vision  -Potential unknown long term risks  Contraindications: You should not have Botox if you are pregnant,  nursing, allergic to albumin, have an infection, skin condition, or muscle weakness at the site of the injection, or have myasthenia gravis, Lambert-Eaton syndrome, or ALS.  It is also possible that as with any injection, there may be an allergic reaction or no effect from the medication. Reduced effectiveness after repeated injections is sometimes seen and rarely infection at the injection site may occur. All care will be taken to prevent these side effects. If therapy is given over a long time, atrophy and wasting in the muscle injected may occur. Occasionally the patient's become refractory to treatment because they develop antibodies to the toxin. In this event, therapy needs to be modified.  I have read the above information and consent to the administration of botulism toxin.    BOTOX PROCEDURE NOTE FOR MIGRAINE HEADACHE    Contraindications and precautions discussed with patient(above). Aseptic procedure was observed and patient tolerated procedure. Procedure performed by Dr. Toni Jerica Creegan  The condition has existed for more than 6 months, and pt does not have a diagnosis of ALS, Myasthenia Gravis or Lambert-Eaton Syndrome.  Risks and benefits of injections discussed and pt agrees to proceed with the procedure.  Written consent obtained  These injections are medically necessary. Pt  receives good benefits from these injections. These injections do not cause sedations or hallucinations which the oral therapies may cause.  Description of procedure:  The patient was placed in a sitting position. The standard protocol was used for Botox as follows, with 5 units of Botox injected at each site:   -Procerus muscle, midline injection  -Corrugator muscle, bilateral injection  -   Lateral Corrugator: 1 unit bilaterally  -Frontalis muscle, bilateral injection, with 2 sites each side, medial injection was performed in the upper one third of the frontalis muscle, in the region vertical from the  medial inferior edge of the superior orbital rim. The lateral injection was again in the upper one third of the forehead vertically above the lateral limbus of the cornea, 1.5 cm lateral to the medial injection site. Additional 10 units in the forehead as patient's headache start in the forehead and eyes.  -Temporalis muscle injection, 4 sites, bilaterally. The first injection was 3 cm above the tragus of the ear, second injection site was 1.5 cm to 3 cm up from the first injection site in line with the tragus of the ear. The third injection site was 1.5-3 cm forward between the first 2 injection sites. The fourth injection site was 1.5 cm posterior to the second injection site.   -Occipitalis muscle injection, 3 sites, bilaterally. The first injection was done one half way between the occipital protuberance and the tip of the mastoid process behind the ear. The second injection site was done lateral and superior to the first, 1 fingerbreadth from the first injection. The third injection site was 1 fingerbreadth superiorly and medially from the first injection site.  -Cervical paraspinal muscle injection, 2 sites, bilateral knee first injection site was 1 cm from the midline of the cervical spine, 3 cm inferior to the lower border of the occipital protuberance. The second injection site was 1.5 cm superiorly and laterally to the first injection site. Additional 1 unit bilaterally.   -Trapezius muscle injection was performed at 3 sites, bilaterally. The first injection site was in the upper trapezius muscle halfway between the inflection point of the neck, and the acromion. The second injection site was one half way between the acromion and the first injection site. The third injection was done between the first injection site and the inflection point of the neck.   Will return for repeat injection in 3 months.   A 200 unit sof Botox was used, any Botox not injected was wasted. The patient tolerated the  procedure well, there were no complications of the above procedure.  I spent 30 minutes of face-to-face and non-face-to-face time with patient on the  1. Chronic migraine without aura without status migrainosus, not intractable    diagnosis.  This included previsit chart review, lab review, study review, order entry, electronic health record documentation, patient education on the different diagnostic and therapeutic options, counseling and coordination of care, risks and benefits of management, compliance, or risk factor reduction

## 2020-08-23 NOTE — Progress Notes (Signed)
Botox- 200 units x 1 vial Lot: [id number] Expiration: 04/2023 NDC: 8016-5537-48  Bacteriostatic 0.9% Sodium Chloride- 68mL total Lot: [id number] Expiration: 04/2023 NDC: 7544-9201-00  Dx: F12.197 S/P

## 2020-11-02 ENCOUNTER — Telehealth: Payer: Self-pay | Admitting: Neurology

## 2020-11-02 NOTE — Telephone Encounter (Signed)
Patient has a Botox appointment 5/26. I received a fax from [facility] stating they have been trying to reach patient to get shipment consent for Botox. I sent patient a mychart message to remind her.   [facility] also attached a letter stating that as of July 1st, 2022, all Botox patents under [facility] will require a medical authorization to be on file. This can be done by going online to [email or web address] or by calling [phone] to complete clinical review.   For patient's upcoming May appointment, I called [facility] provider services and spoke to [facility] to check codes [phone] and [phone]. Ally states no PA is required. She advised that office notes will need to be sent upon claim submission (billing). Reference [id number].

## 2020-11-07 NOTE — Telephone Encounter (Signed)
Submitted PA request for Botox to [employer] using [employer] portal. The case is pending review.

## 2020-11-14 NOTE — Telephone Encounter (Signed)
Botox was approved for B/B. Approved from 11/07/20- 11/06/21.

## 2020-11-23 NOTE — Progress Notes (Signed)
11/24/2020: Stable, doing great  Interval history 05/09/2020: Tremendous improvement, much greater than 70% decrease in migraine frequency and severity and duration.See notes in email. Baseline was daily headaches and 1 migraine days monthly. Now she can go weeks without even one headache. The migraines she does have are much less severe and easier to treat. She has maybe 1 migraine day a week much improved > 50% improvement in both frequency and severity. Tried ubrelvy not so great. Will give samples of nurtec for acute management, likes zipsor the best. NO MASSETERS.     Consent Form Botulism Toxin Injection For Chronic Migraine    Reviewed orally with patient, additionally signature is on file:  Botulism toxin has been approved by the Federal drug administration for treatment of chronic migraine. Botulism toxin does not cure chronic migraine and it may not be effective in some patients.  The administration of botulism toxin is accomplished by injecting a small amount of toxin into the muscles of the neck and head. Dosage must be titrated for each individual. Any benefits resulting from botulism toxin tend to wear off after 3 months with a repeat injection required if benefit is to be maintained. Injections are usually done every 3-4 months with maximum effect peak achieved by about 2 or 3 weeks. Botulism toxin is expensive and you should be sure of what costs you will incur resulting from the injection.  The side effects of botulism toxin use for chronic migraine may include:   -Transient, and usually mild, facial weakness with facial injections  -Transient, and usually mild, head or neck weakness with head/neck injections  -Reduction or loss of forehead facial animation due to forehead muscle weakness  -Eyelid drooping  -Dry eye  -Pain at the site of injection or bruising at the site of injection  -Double vision  -Potential unknown long term risks  Contraindications: You should not have  Botox if you are pregnant, nursing, allergic to albumin, have an infection, skin condition, or muscle weakness at the site of the injection, or have myasthenia gravis, Lambert-Eaton syndrome, or ALS.  It is also possible that as with any injection, there may be an allergic reaction or no effect from the medication. Reduced effectiveness after repeated injections is sometimes seen and rarely infection at the injection site may occur. All care will be taken to prevent these side effects. If therapy is given over a long time, atrophy and wasting in the muscle injected may occur. Occasionally the patient's become refractory to treatment because they develop antibodies to the toxin. In this event, therapy needs to be modified.  I have read the above information and consent to the administration of botulism toxin.    BOTOX PROCEDURE NOTE FOR MIGRAINE HEADACHE    Contraindications and precautions discussed with patient(above). Aseptic procedure was observed and patient tolerated procedure. Procedure performed by Dr. Artemio Aly  The condition has existed for more than 6 months, and pt does not have a diagnosis of ALS, Myasthenia Gravis or Lambert-Eaton Syndrome.  Risks and benefits of injections discussed and pt agrees to proceed with the procedure.  Written consent obtained  These injections are medically necessary. Pt  receives good benefits from these injections. These injections do not cause sedations or hallucinations which the oral therapies may cause.  Description of procedure:  The patient was placed in a sitting position. The standard protocol was used for Botox as follows, with 5 units of Botox injected at each site:   -Procerus muscle, midline injection  -  Corrugator muscle, bilateral injection  - Lateral Corrugator: 1 unit bilaterally  -Frontalis muscle, bilateral injection, with 2 sites each side, medial injection was performed in the upper one third of the frontalis muscle, in the  region vertical from the medial inferior edge of the superior orbital rim. The lateral injection was again in the upper one third of the forehead vertically above the lateral limbus of the cornea, 1.5 cm lateral to the medial injection site. Additional 10 units in the forehead as patient's headache start in the forehead and eyes.  -Temporalis muscle injection, 4 sites, bilaterally. The first injection was 3 cm above the tragus of the ear, second injection site was 1.5 cm to 3 cm up from the first injection site in line with the tragus of the ear. The third injection site was 1.5-3 cm forward between the first 2 injection sites. The fourth injection site was 1.5 cm posterior to the second injection site.   -Occipitalis muscle injection, 3 sites, bilaterally. The first injection was done one half way between the occipital protuberance and the tip of the mastoid process behind the ear. The second injection site was done lateral and superior to the first, 1 fingerbreadth from the first injection. The third injection site was 1 fingerbreadth superiorly and medially from the first injection site.  -Cervical paraspinal muscle injection, 2 sites, bilateral knee first injection site was 1 cm from the midline of the cervical spine, 3 cm inferior to the lower border of the occipital protuberance. The second injection site was 1.5 cm superiorly and laterally to the first injection site. Additional 1 unit bilaterally.   -Trapezius muscle injection was performed at 3 sites, bilaterally. The first injection site was in the upper trapezius muscle halfway between the inflection point of the neck, and the acromion. The second injection site was one half way between the acromion and the first injection site. The third injection was done between the first injection site and the inflection point of the neck.   Will return for repeat injection in 3 months.   A 200 unit sof Botox was used, any Botox not injected was wasted.  The patient tolerated the procedure well, there were no complications of the above procedure.  I spent 30 minutes of face-to-face and non-face-to-face time with patient on the  No diagnosis found. diagnosis.  This included previsit chart review, lab review, study review, order entry, electronic health record documentation, patient education on the different diagnostic and therapeutic options, counseling and coordination of care, risks and benefits of management, compliance, or risk factor reduction

## 2020-11-24 ENCOUNTER — Ambulatory Visit: Payer: 59 | Admitting: Neurology

## 2020-11-24 DIAGNOSIS — G43709 Chronic migraine without aura, not intractable, without status migrainosus: Secondary | ICD-10-CM | POA: Diagnosis not present

## 2020-11-24 MED ORDER — CAMBIA 50 MG PO PACK: PACK | ORAL | 11 refills | Status: DC

## 2020-11-24 NOTE — Progress Notes (Signed)
Botox- 200 units x 1 vial Lot: [id number] Expiration: 06/2023 NDC: 7026-3785-88  Bacteriostatic 0.9% Sodium Chloride- 36mL total Lot: [id number] Expiration: 11/30/2021 NDC: 1287-8676-72  Dx: C94.709 B/B

## 2020-12-30 ENCOUNTER — Other Ambulatory Visit: Payer: Self-pay | Admitting: Neurology

## 2021-02-27 ENCOUNTER — Ambulatory Visit: Payer: 59 | Admitting: Neurology

## 2021-02-27 ENCOUNTER — Encounter: Payer: Self-pay | Admitting: Neurology

## 2021-02-27 DIAGNOSIS — G43709 Chronic migraine without aura, not intractable, without status migrainosus: Secondary | ICD-10-CM

## 2021-02-27 DIAGNOSIS — E669 Obesity, unspecified: Secondary | ICD-10-CM

## 2021-02-27 MED ORDER — ONDANSETRON 4 MG PO TBDP: 4.0000 mg | ORAL_TABLET | Freq: Three times a day (TID) | ORAL | 11 refills | Status: DC | PRN

## 2021-02-27 NOTE — Progress Notes (Signed)
02/27/2021: works full time from home. Doing great, stable, refill zofran, [facility], she has gained a lot, discussed wegovy, referred, also chelle jeffery would be good, she is also getting an mba at night, >70% improvement migraines.   11/24/2020: Stable, doing great  Interval history 05/09/2020: Tremendous improvement, much greater than 70% decrease in migraine frequency and severity and duration.See notes in email. Baseline was daily headaches and 1 migraine days monthly. Now she can go weeks without even one headache. The migraines she does have are much less severe and easier to treat. She has maybe 1 migraine day a week much improved > 50% improvement in both frequency and severity. Tried ubrelvy not so great. Will give samples of nurtec for acute management, likes zipsor the best. NO CLENCHING.     Consent Form Botulism Toxin Injection For Chronic Migraine    Reviewed orally with patient, additionally signature is on file:  Botulism toxin has been approved by the Federal drug administration for treatment of chronic migraine. Botulism toxin does not cure chronic migraine and it may not be effective in some patients.  The administration of botulism toxin is accomplished by injecting a small amount of toxin into the muscles of the neck and head. Dosage must be titrated for each individual. Any benefits resulting from botulism toxin tend to wear off after 3 months with a repeat injection required if benefit is to be maintained. Injections are usually done every 3-4 months with maximum effect peak achieved by about 2 or 3 weeks. Botulism toxin is expensive and you should be sure of what costs you will incur resulting from the injection.  The side effects of botulism toxin use for chronic migraine may include:   -Transient, and usually mild, facial weakness with facial injections  -Transient, and usually mild, head or neck weakness with head/neck injections  -Reduction or  loss of forehead facial animation due to forehead muscle weakness  -Eyelid drooping  -Dry eye  -Pain at the site of injection or bruising at the site of injection  -Double vision  -Potential unknown long term risks  Contraindications: You should not have Botox if you are pregnant, nursing, allergic to albumin, have an infection, skin condition, or muscle weakness at the site of the injection, or have myasthenia gravis, Lambert-Eaton syndrome, or ALS.  It is also possible that as with any injection, there may be an allergic reaction or no effect from the medication. Reduced effectiveness after repeated injections is sometimes seen and rarely infection at the injection site may occur. All care will be taken to prevent these side effects. If therapy is given over a long time, atrophy and wasting in the muscle injected may occur. Occasionally the patient's become refractory to treatment because they develop antibodies to the toxin. In this event, therapy needs to be modified.  I have read the above information and consent to the administration of botulism toxin.    BOTOX PROCEDURE NOTE FOR MIGRAINE HEADACHE    Contraindications and precautions discussed with patient(above). Aseptic procedure was observed and patient tolerated procedure. Procedure performed by Dr. Artemio Aly  The condition has existed for more than 6 months, and pt does not have a diagnosis of ALS, Myasthenia Gravis or Lambert-Eaton Syndrome.  Risks and benefits of injections discussed and pt agrees to proceed with the procedure.  Written consent obtained  These injections are medically necessary. Pt  receives good benefits from these injections. These injections do not cause sedations or hallucinations which  the oral therapies may cause.  Description of procedure:  The patient was placed in a sitting position. The standard protocol was used for Botox as follows, with 5 units of Botox injected at each site:   -Procerus  muscle, midline injection  -Corrugator muscle, bilateral injection  -Frontalis muscle, bilateral injection, with 2 sites each side, medial injection was performed in the upper one third of the frontalis muscle, in the region vertical from the medial inferior edge of the superior orbital rim. The lateral injection was again in the upper one third of the forehead vertically above the lateral limbus of the cornea, 1.5 cm lateral to the medial injection site. Additional 10 units in the forehead as patient's headache start in the forehead and eyes.  -Temporalis muscle injection, 4 sites, bilaterally. The first injection was 3 cm above the tragus of the ear, second injection site was 1.5 cm to 3 cm up from the first injection site in line with the tragus of the ear. The third injection site was 1.5-3 cm forward between the first 2 injection sites. The fourth injection site was 1.5 cm posterior to the second injection site.   -Occipitalis muscle injection, 3 sites, bilaterally. The first injection was done one half way between the occipital protuberance and the tip of the mastoid process behind the ear. The second injection site was done lateral and superior to the first, 1 fingerbreadth from the first injection. The third injection site was 1 fingerbreadth superiorly and medially from the first injection site.  -Cervical paraspinal muscle injection, 2 sites, bilateral knee first injection site was 1 cm from the midline of the cervical spine, 3 cm inferior to the lower border of the occipital protuberance. The second injection site was 1.5 cm superiorly and laterally to the first injection site. Additional 1 unit bilaterally.   -Trapezius muscle injection was performed at 3 sites, bilaterally. The first injection site was in the upper trapezius muscle halfway between the inflection point of the neck, and the acromion. The second injection site was one half way between the acromion and the first injection site.  The third injection was done between the first injection site and the inflection point of the neck.   Will return for repeat injection in 3 months.   A 155 unit sof Botox was used, any Botox not injected was wasted. The patient tolerated the procedure well, there were no complications of the above procedure.

## 2021-02-27 NOTE — Progress Notes (Signed)
Botox- 200 units x 1 vial Lot: [id number] Expiration: 07/2023 NDC: 0023-3921-02  Bacteriostatic 0.9% Sodium Chloride- 4mL total Lot: [id number] Expiration: 07/02/2022 NDC: 0409-1966-02  Dx: G43.709 B/B  

## 2021-03-07 ENCOUNTER — Other Ambulatory Visit: Payer: Self-pay | Admitting: Neurology

## 2021-03-07 MED ORDER — FROVATRIPTAN SUCCINATE 2.5 MG PO TABS: 2.5000 mg | ORAL_TABLET | Freq: Three times a day (TID) | ORAL | 0 refills | Status: DC

## 2021-03-07 MED ORDER — METHYLPREDNISOLONE 4 MG PO TBPK: ORAL_TABLET | ORAL | 1 refills | Status: DC

## 2021-05-12 ENCOUNTER — Other Ambulatory Visit: Payer: Self-pay

## 2021-05-12 ENCOUNTER — Other Ambulatory Visit: Payer: Self-pay | Admitting: Neurology

## 2021-05-12 MED ORDER — TIZANIDINE HCL 4 MG PO TABS: 4.0000 mg | ORAL_TABLET | Freq: Four times a day (QID) | ORAL | 0 refills | Status: DC | PRN

## 2021-05-30 ENCOUNTER — Ambulatory Visit: Payer: 59 | Admitting: Neurology

## 2021-05-30 ENCOUNTER — Encounter: Payer: Self-pay | Admitting: Neurology

## 2021-05-30 DIAGNOSIS — G43709 Chronic migraine without aura, not intractable, without status migrainosus: Secondary | ICD-10-CM

## 2021-05-30 NOTE — Progress Notes (Signed)
02/27/2021: works full time from home. Doing great, stable, refill zofran, [facility], she has gained a lot, discussed wegovy, referred, also chelle jeffery would be good, she is also getting an mba at night, >70% improvement migraines.   11/24/2020: Stable, doing great  Interval history 05/09/2020: Tremendous improvement, much greater than 70% decrease in migraine frequency and severity and duration.See notes in email. Baseline was daily headaches and 1 migraine days monthly. Now she can go weeks without even one headache. The migraines she does have are much less severe and easier to treat. She has maybe 1 migraine day a week much improved > 50% improvement in both frequency and severity. Tried ubrelvy not so great. Will give samples of nurtec for acute management, likes zipsor the best. NO CLENCHING.     Consent Form Botulism Toxin Injection For Chronic Migraine   05/30/2021: She is doing great, >80% decrease in frequency and severity   Reviewed orally with patient, additionally signature is on file:  Botulism toxin has been approved by the Federal drug administration for treatment of chronic migraine. Botulism toxin does not cure chronic migraine and it may not be effective in some patients.  The administration of botulism toxin is accomplished by injecting a small amount of toxin into the muscles of the neck and head. Dosage must be titrated for each individual. Any benefits resulting from botulism toxin tend to wear off after 3 months with a repeat injection required if benefit is to be maintained. Injections are usually done every 3-4 months with maximum effect peak achieved by about 2 or 3 weeks. Botulism toxin is expensive and you should be sure of what costs you will incur resulting from the injection.  The side effects of botulism toxin use for chronic migraine may include:   -Transient, and usually mild, facial weakness with facial injections  -Transient, and  usually mild, head or neck weakness with head/neck injections  -Reduction or loss of forehead facial animation due to forehead muscle weakness  -Eyelid drooping  -Dry eye  -Pain at the site of injection or bruising at the site of injection  -Double vision  -Potential unknown long term risks  Contraindications: You should not have Botox if you are pregnant, nursing, allergic to albumin, have an infection, skin condition, or muscle weakness at the site of the injection, or have myasthenia gravis, Lambert-Eaton syndrome, or ALS.  It is also possible that as with any injection, there may be an allergic reaction or no effect from the medication. Reduced effectiveness after repeated injections is sometimes seen and rarely infection at the injection site may occur. All care will be taken to prevent these side effects. If therapy is given over a long time, atrophy and wasting in the muscle injected may occur. Occasionally the patient's become refractory to treatment because they develop antibodies to the toxin. In this event, therapy needs to be modified.  I have read the above information and consent to the administration of botulism toxin.    BOTOX PROCEDURE NOTE FOR MIGRAINE HEADACHE    Contraindications and precautions discussed with patient(above). Aseptic procedure was observed and patient tolerated procedure. Procedure performed by Dr. Artemio Aly  The condition has existed for more than 6 months, and pt does not have a diagnosis of ALS, Myasthenia Gravis or Lambert-Eaton Syndrome.  Risks and benefits of injections discussed and pt agrees to proceed with the procedure.  Written consent obtained  These injections are medically necessary. Pt  receives good benefits  from these injections. These injections do not cause sedations or hallucinations which the oral therapies may cause.  Description of procedure:  The patient was placed in a sitting position. The standard protocol was used for Botox  as follows, with 5 units of Botox injected at each site:   -Procerus muscle, midline injection  -Corrugator muscle, bilateral injection  -Frontalis muscle, bilateral injection, with 2 sites each side, medial injection was performed in the upper one third of the frontalis muscle, in the region vertical from the medial inferior edge of the superior orbital rim. The lateral injection was again in the upper one third of the forehead vertically above the lateral limbus of the cornea, 1.5 cm lateral to the medial injection site. Additional 10 units in the forehead as patient's headache start in the forehead and eyes.  -Temporalis muscle injection, 4 sites, bilaterally. The first injection was 3 cm above the tragus of the ear, second injection site was 1.5 cm to 3 cm up from the first injection site in line with the tragus of the ear. The third injection site was 1.5-3 cm forward between the first 2 injection sites. The fourth injection site was 1.5 cm posterior to the second injection site.   -Occipitalis muscle injection, 3 sites, bilaterally. The first injection was done one half way between the occipital protuberance and the tip of the mastoid process behind the ear. The second injection site was done lateral and superior to the first, 1 fingerbreadth from the first injection. The third injection site was 1 fingerbreadth superiorly and medially from the first injection site.  -Cervical paraspinal muscle injection, 2 sites, bilateral knee first injection site was 1 cm from the midline of the cervical spine, 3 cm inferior to the lower border of the occipital protuberance. The second injection site was 1.5 cm superiorly and laterally to the first injection site. Additional 1 unit bilaterally.   -Trapezius muscle injection was performed at 3 sites, bilaterally. The first injection site was in the upper trapezius muscle halfway between the inflection point of the neck, and the acromion. The second injection  site was one half way between the acromion and the first injection site. The third injection was done between the first injection site and the inflection point of the neck.   Will return for repeat injection in 3 months.   A 155 unit sof Botox was used, 45 Botox not injected was wasted. The patient tolerated the procedure well, there were no complications of the above procedure.

## 2021-05-30 NOTE — Progress Notes (Signed)
Botox- 200 units x 1 vial Lot: [id number] Expiration: 10/2023 NDC: 0023-3921-02  Bacteriostatic 0.9% Sodium Chloride- 4mL total Lot: [id number] Expiration: 07/02/2022 NDC: 0409-1966-02  Dx: G43.709 B/B  

## 2021-08-17 ENCOUNTER — Other Ambulatory Visit: Payer: Self-pay | Admitting: Neurology

## 2021-08-29 ENCOUNTER — Ambulatory Visit: Payer: 59 | Admitting: Neurology

## 2021-09-06 ENCOUNTER — Other Ambulatory Visit: Payer: Self-pay

## 2021-09-06 ENCOUNTER — Ambulatory Visit: Payer: 59 | Admitting: Adult Health

## 2021-09-06 DIAGNOSIS — Z0289 Encounter for other administrative examinations: Secondary | ICD-10-CM

## 2021-09-06 DIAGNOSIS — G43709 Chronic migraine without aura, not intractable, without status migrainosus: Secondary | ICD-10-CM | POA: Diagnosis not present

## 2021-09-06 NOTE — Progress Notes (Signed)
Botox- 200 units x 1 vial ?Lot: [id number] ?Expiration: 03/2024 ?NDC: 0023-3921-02 ? ?Bacteriostatic 0.9% Sodium Chloride- 4mL total ?Lot: [id number] ?Expiration: 01/31/2023 ?NDC: 0409-1966-02 ? ?Dx: G43.709 ?B/B ? ?

## 2021-09-06 NOTE — Progress Notes (Signed)
? ? ?  09/06/21: Reports that migraines have decreased since she started Botox therapy.  ? ? ?BOTOX PROCEDURE NOTE FOR MIGRAINE HEADACHE ? ? ? ?Contraindications and precautions discussed with patient(above). Aseptic procedure was observed and patient tolerated procedure. Procedure performed by Butch Penny, NP ? ?The condition has existed for more than 6 months, and pt does not have a diagnosis of ALS, Myasthenia Gravis or Lambert-Eaton Syndrome.  Risks and benefits of injections discussed and pt agrees to proceed with the procedure.  Written consent obtained ? ?These injections are medically necessary.  These injections do not cause sedations or hallucinations which the oral therapies may cause. ? ?Indication/Diagnosis: chronic migraine ?ZGYFV([id number]) injection was performed according to protocol by Allergan. 200 units of BOTOX was dissolved into 4 cc NS.   ?NDC: [phone] ? ?Type of toxin: Botox ? ?Botox- 200 units x 1 vial ?Lot: [id number] ?Expiration: 03/2024 ?NDC: [phone] ?  ?Bacteriostatic 0.9% Sodium Chloride- 77mL total ?Lot: [id number] ?Expiration: -01/31/2023 ?NDC: 2330-0762-26 ?  ?Dx: J33.545 ?Description of procedure: ? ?The patient was placed in a sitting position. The standard protocol was used for Botox as follows, with 5 units of Botox injected at each site: ? ? ?-Procerus muscle, midline injection ? ?-Corrugator muscle, bilateral injection ? ?-Frontalis muscle, bilateral injection, with 2 sites each side, medial injection was performed in the upper one third of the frontalis muscle, in the region vertical from the medial inferior edge of the superior orbital rim. The lateral injection was again in the upper one third of the forehead vertically above the lateral limbus of the cornea, 1.5 cm lateral to the medial injection site.  ? ?-Temporalis muscle injection, 4 sites, bilaterally. The first injection was 3 cm above the tragus of the ear, second injection site was 1.5 cm to 3 cm up from the first  injection site in line with the tragus of the ear. The third injection site was 1.5-3 cm forward between the first 2 injection sites. The fourth injection site was 1.5 cm posterior to the second injection site. ? ?-Occipitalis muscle injection, 3 sites, bilaterally. The first injection was done one half way between the occipital protuberance and the tip of the mastoid process behind the ear. The second injection site was done lateral and superior to the first, 1 fingerbreadth from the first injection. The third injection site was 1 fingerbreadth superiorly and medially from the first injection site. ? ?-Cervical paraspinal muscle injection, 2 sites, bilateral knee first injection site was 1 cm from the midline of the cervical spine, 3 cm inferior to the lower border of the occipital protuberance. The second injection site was 1.5 cm superiorly and laterally to the first injection site. ? ?-Trapezius muscle deferred- patient refused ? ? ?Will return for repeat injection in 3 months. ? ? ?A 200 units of Botox was used, 125 units were injected, the rest of the Botox was wasted. The patient tolerated the procedure well, there were no complications of the above procedure. ? ?Butch Penny, MSN, NP-C 09/06/2021, 9:10 AM ?[facility] ?[address], [address] ?[place], [place] [address] ?[phone] ?

## 2021-09-13 ENCOUNTER — Ambulatory Visit (INDEPENDENT_AMBULATORY_CARE_PROVIDER_SITE_OTHER): Payer: 59 | Admitting: Family Medicine

## 2021-09-13 ENCOUNTER — Encounter (INDEPENDENT_AMBULATORY_CARE_PROVIDER_SITE_OTHER): Payer: Self-pay

## 2021-09-21 ENCOUNTER — Other Ambulatory Visit: Payer: Self-pay

## 2021-09-21 ENCOUNTER — Encounter (INDEPENDENT_AMBULATORY_CARE_PROVIDER_SITE_OTHER): Payer: Self-pay | Admitting: Bariatrics

## 2021-09-21 ENCOUNTER — Ambulatory Visit (INDEPENDENT_AMBULATORY_CARE_PROVIDER_SITE_OTHER): Payer: 59 | Admitting: Bariatrics

## 2021-09-21 VITALS — BP 139/83 | HR 65 | Temp 98.1°F | Ht 62.0 in | Wt 177.0 lb

## 2021-09-21 DIAGNOSIS — E282 Polycystic ovarian syndrome: Secondary | ICD-10-CM

## 2021-09-21 DIAGNOSIS — E739 Lactose intolerance, unspecified: Secondary | ICD-10-CM

## 2021-09-21 DIAGNOSIS — Z1331 Encounter for screening for depression: Secondary | ICD-10-CM | POA: Diagnosis not present

## 2021-09-21 DIAGNOSIS — G43709 Chronic migraine without aura, not intractable, without status migrainosus: Secondary | ICD-10-CM

## 2021-09-21 DIAGNOSIS — R0602 Shortness of breath: Secondary | ICD-10-CM

## 2021-09-21 DIAGNOSIS — E669 Obesity, unspecified: Secondary | ICD-10-CM

## 2021-09-21 DIAGNOSIS — K581 Irritable bowel syndrome with constipation: Secondary | ICD-10-CM

## 2021-09-21 DIAGNOSIS — E559 Vitamin D deficiency, unspecified: Secondary | ICD-10-CM

## 2021-09-21 DIAGNOSIS — R5383 Other fatigue: Secondary | ICD-10-CM | POA: Diagnosis not present

## 2021-09-21 DIAGNOSIS — E668 Other obesity: Secondary | ICD-10-CM

## 2021-09-21 DIAGNOSIS — E66811 Obesity, class 1: Secondary | ICD-10-CM

## 2021-09-21 DIAGNOSIS — R7309 Other abnormal glucose: Secondary | ICD-10-CM

## 2021-09-21 DIAGNOSIS — Z6832 Body mass index (BMI) 32.0-32.9, adult: Secondary | ICD-10-CM

## 2021-09-21 DIAGNOSIS — E7800 Pure hypercholesterolemia, unspecified: Secondary | ICD-10-CM

## 2021-09-21 NOTE — Progress Notes (Signed)
?Office: [phone]  /  Fax: [phone] ? ? ? ?Date: October 02, 2021   ?Appointment Start Time: 11:01am ?Duration: 46 minutes ?Provider: Lawerance Cruel, Psy.D. ?Type of Session: Intake for Individual Therapy  ?Location of Patient: Home (private location) ?Location of Provider: Provider's home (private office) ?Type of Contact: Telepsychological Visit via MyChart Video Visit ? ?Informed Consent: Prior to proceeding with today's appointment, two pieces of identifying information were obtained. In addition, Gianne's physical location at the time of this appointment was obtained as well a phone number she could be reached at in the event of technical difficulties. Sequoia and this provider participated in today's telepsychological service.  ? ?The provider's role was explained to [employer]. The provider reviewed and discussed issues of confidentiality, privacy, and limits therein (e.g., reporting obligations). In addition to verbal informed consent, written informed consent for psychological services was obtained prior to the initial appointment. Since the clinic is not a 24/7 crisis center, mental health emergency resources were shared and this  provider explained MyChart, e-mail, voicemail, and/or other messaging systems should be utilized only for non-emergency reasons. This provider also explained that information obtained during appointments will be placed in Shanan's medical record and relevant information will be shared with other providers at [facility] for coordination of care. Katerine agreed information may be shared with other [facility] providers as needed for coordination of care and by signing the service agreement document, she provided written consent for coordination of care. Prior to initiating telepsychological services, Rhian completed an informed consent document, which included the development of a safety plan (i.e., an emergency contact and emergency  resources) in the event of an emergency/crisis. Mushka verbally acknowledged understanding she is ultimately responsible for understanding her insurance benefits for telepsychological and in-person services. This provider also reviewed confidentiality, as it relates to telepsychological services, as well as the rationale for telepsychological services (i.e., to reduce exposure risk to COVID-19). Marrisa  acknowledged understanding that appointments cannot be recorded without both party consent and she is aware she is responsible for securing confidentiality on her end of the session. Shadaya verbally consented to proceed. ? ?Chief Complaint/HPI: Consuello was referred by Dr. Corinna Capra during the initial visit on September 21, 2021. The note for the initial appointment indicated the following: "Kairah's habits were reviewed today and are as follows: Her family eats meals together, she thinks her family will eat healthier with her, her desired weight loss is 42 pounds, she has been heavy most of her life, she started gaining weight I've always had a full figure, it got worse during college, her heaviest weight ever was 210 pounds, she has significant food cravings issues, she snacks frequently in the evenings, she wakes up frequently in the middle of the night to eat, she skips meals frequently, she is frequently drinking liquids with calories, she frequently makes poor food choices, she has problems with excessive hunger, she frequently eats larger portions than normal, she has binge eating behaviors, and she struggles with emotional eating." Tempestt's Food and Mood (modified PHQ-9) score on September 21, 2021 was 15. ? ?During today's appointment, [name] shared she inquired about meeting with this provider as she has a "psychological connection to eating." She explained various emotions (e.g., happy, sad, bored) can impact her eating habits. She shared she craves chocolates and salty snacks, noting she will "alternate." Imara  believes the onset of emotional eating behaviors was likely in childhood, and described the current frequency of emotional eating  behaviors as "5 out of 7 days." She added her menstrual cycle likely impacts her cravings. In addition, Morrie Sheldonshley endorsed engaging in binge eating behaviors, adding it is in the evenings. This was further explored. She explained when she is focused on work, she will not eat much during the day resulting in her mindlessly eating and snacking in the evenings while watching TV. She further discussed becoming "nervous about eating around other people" due to worry about being judged. She indicated she is working to reduce the aforementioned. Moreover, she stated in middle and high school she engaged in disordered eating behaviors (food restriction and binge eating), adding she was in dance and was told she weighed too much. Morrie Sheldonshley noted she has never been diagnosed with an eating disorder and never received treatment. Furthermore, Morrie Sheldonshley denied other problems of concern.   ? ?Mental Status Examination:  ?Appearance: neat ?Behavior: appropriate to circumstances ?Mood: neutral ?Affect: mood congruent ?Speech: WNL ?Eye Contact: appropriate ?Psychomotor Activity: WNL ?Gait: unable to assess  ?Thought Process: linear, logical, and goal directed and denies suicidal, homicidal, and self-harm ideation, plan and intent  ?Thought Content/Perception: no hallucinations, delusions, bizarre thinking or behavior endorsed or observed ?Orientation: AAOx4 ?Memory/Concentration: memory, attention, language, and fund of knowledge intact  ?Insight/Judgment: good ? ?Family & Psychosocial History: Morrie Sheldonshley reported she is in a relationship and she does not have any children. She indicated she is currently employed full time as a Emergency planning/management officerproject manager. Additionally, Morrie Sheldonshley shared her highest level of education obtained is a bachelor's degree, noting she is working on her [employer]. Currently, Nakiyah's social support system consists  of her boyfriend, parents, and friends. Moreover, Morrie Sheldonshley stated she resides with her dog. ? ?Medical History:  ?Past Medical History:  ?Diagnosis Date  ? Anemia   ? Anxiety   ? Back pain   ? Constipation   ? Depression   ? GERD (gastroesophageal reflux disease)   ? IBS (irritable bowel syndrome)   ? Joint pain   ? Lactose intolerance   ? Migraine   ? Polycystic ovary disease   ? Thyroid nodule   ? ?Past Surgical History:  ?Procedure Laterality Date  ? BUNIONECTOMY Left 2014  ? TONSILLECTOMY AND ADENOIDECTOMY  2010  ? UPPER GI ENDOSCOPY  2017  ? WISDOM TOOTH EXTRACTION  2005  ? ?Current Outpatient Medications on File Prior to Visit  ?Medication Sig Dispense Refill  ? atenolol (TENORMIN) 100 MG tablet TAKE 1 TABLET BY MOUTH EVERY DAY 90 tablet 1  ? BOTOX 100 units SOLR injection INJECT 155 UNITS INTRAMUSCULARLY INTO HEAD AND NECK EVERY 3 MONTHS. 2 each 2  ? buPROPion (WELLBUTRIN SR) 150 MG 12 hr tablet TAKE 1 TABLET BY MOUTH EVERY DAY 90 tablet 3  ? Diclofenac Potassium 25 MG CAPS Take 1 capsule by mouth as needed (migraine). May repeat in 2 hours. (Patient not taking: Reported on 09/21/2021)    ? Diclofenac Potassium,Migraine, (CAMBIA) 50 MG PACK Take 1 packet (50 mg) for treatment of migraines. Empty contents into 1-2 ounces (30-60 mL) of water. Mix well and drink immediately. 9 each 11  ? frovatriptan (FROVA) 2.5 MG tablet Take 1 tablet (2.5 mg total) by mouth 3 (three) times daily. For 3 days for intractable headache. (Patient not taking: Reported on 09/21/2021) 9 tablet 0  ? LORazepam (ATIVAN) 0.5 MG tablet Take 1 tablet (0.5 mg total) by mouth every 8 (eight) hours. (Patient not taking: Reported on 09/21/2021) 30 tablet 4  ? methylPREDNISolone (MEDROL DOSEPAK) 4 MG TBPK tablet  Take pills all together daily with food for 6 days. Take forst dose as soon as possible. Take other doses in the morning. 6-5-4-3-2-1 (Patient not taking: Reported on 09/21/2021) 21 tablet 1  ? Multiple Vitamin (MULTIVITAMIN) capsule Take 1  capsule by mouth daily.    ? naproxen (NAPROSYN) 500 MG tablet Take 500 mg by mouth as needed.  (Patient not taking: Reported on 09/21/2021)    ? ondansetron (ZOFRAN-ODT) 4 MG disintegrating tablet Take 1 tablet

## 2021-09-22 LAB — LIPID PANEL WITH LDL/HDL RATIO
Cholesterol, Total: 197 mg/dL (ref 100–199)
HDL: 67 mg/dL (ref >39)
LDL Chol Calc (NIH): 117 mg/dL — ABNORMAL HIGH (ref 0–99)
LDL/HDL Ratio: 1.7 ratio (ref 0.0–3.2)
Triglycerides: 70 mg/dL (ref 0–149)
VLDL Cholesterol Cal: 13 mg/dL (ref 5–40)

## 2021-09-22 LAB — COMPREHENSIVE METABOLIC PANEL
ALT: 24 IU/L (ref 0–32)
AST: 16 IU/L (ref 0–40)
Albumin/Globulin Ratio: 2.6 — ABNORMAL HIGH (ref 1.2–2.2)
Albumin: 4.7 g/dL (ref 3.8–4.8)
Alkaline Phosphatase: 62 IU/L (ref 44–121)
BUN/Creatinine Ratio: 21 (ref 9–23)
BUN: 16 mg/dL (ref 6–20)
Bilirubin Total: 0.3 mg/dL (ref 0.0–1.2)
CO2: 25 mmol/L (ref 20–29)
Calcium: 9.6 mg/dL (ref 8.7–10.2)
Chloride: 101 mmol/L (ref 96–106)
Creatinine, Ser: 0.75 mg/dL (ref 0.57–1.00)
Globulin, Total: 1.8 g/dL (ref 1.5–4.5)
Glucose: 83 mg/dL (ref 70–99)
Potassium: 4.8 mmol/L (ref 3.5–5.2)
Sodium: 142 mmol/L (ref 134–144)
Total Protein: 6.5 g/dL (ref 6.0–8.5)
eGFR: 108 mL/min/{1.73_m2} (ref >59)

## 2021-09-22 LAB — HEMOGLOBIN A1C
Est. average glucose Bld gHb Est-mCnc: 91 mg/dL
Hgb A1c MFr Bld: 4.8 % (ref 4.8–5.6)

## 2021-09-22 LAB — TSH+T4F+T3FREE
Free T4: 1.21 ng/dL (ref 0.82–1.77)
T3, Free: 2.6 pg/mL (ref 2.0–4.4)
TSH: 1.35 u[IU]/mL (ref 0.450–4.500)

## 2021-09-22 LAB — INSULIN, RANDOM: INSULIN: 11.2 u[IU]/mL (ref 2.6–24.9)

## 2021-09-22 LAB — VITAMIN D 25 HYDROXY (VIT D DEFICIENCY, FRACTURES): Vit D, 25-Hydroxy: 40.3 ng/mL (ref 30.0–100.0)

## 2021-09-24 NOTE — Progress Notes (Signed)
? ? ? ?Chief Complaint:  ? ?OBESITY ?Krista Henson (MR# [id number]) is a 31 y.o. female who presents for evaluation and treatment of obesity and related comorbidities. Current BMI is Body mass index is 32.37 kg/m?Marland Kitchen Krista Henson has been struggling with her weight for many years and has been unsuccessful in either losing weight, maintaining weight loss, or reaching her healthy weight goal. ? ?Krista Henson likes to [employer] but notes that it is hard to cook. She craves chocolate, fast food and carbohydrates.  ? ?Krista Henson is currently in the action stage of change and ready to dedicate time achieving and maintaining a healthier weight. Krista Henson is interested in becoming our patient and working on intensive lifestyle modifications including (but not limited to) diet and exercise for weight loss. ? ?Krista Henson's habits were reviewed today and are as follows: Her family eats meals together, she thinks her family will eat healthier with her, her desired weight loss is 42 pounds, she has been heavy most of her life, she started gaining weight I've always had a full figure, it got worse during college, her heaviest weight ever was 210 pounds, she has significant food cravings issues, she snacks frequently in the evenings, she wakes up frequently in the middle of the night to eat, she skips meals frequently, she is frequently drinking liquids with calories, she frequently makes poor food choices, she has problems with excessive hunger, she frequently eats larger portions than normal, she has binge eating behaviors, and she struggles with emotional eating. ? ?Depression Screen ?Krista Henson's Food and Mood (modified PHQ-9) score was 15. ? ? ?  09/21/2021  ?  9:17 AM  ?Depression screen PHQ 2/9  ?Decreased Interest 1  ?Down, Depressed, Hopeless 3  ?PHQ - 2 Score 4  ?Altered sleeping 1  ?Tired, decreased energy 2  ?Change in appetite 3  ?Feeling bad or failure about yourself  1  ?Trouble concentrating 3  ?Moving slowly or fidgety/restless 1  ?Suicidal  thoughts 0  ?PHQ-9 Score 15  ?Difficult doing work/chores Somewhat difficult  ? ?Subjective:  ? ?1. Other fatigue ?Krista Henson will continue activities. Krista Henson admits to daytime somnolence and admits to waking up still tired. Patient has a history of symptoms of daytime fatigue, morning fatigue, and morning headache. Cadence generally gets 6 hours of sleep per night, and states that she has generally restful sleep. Snoring is present. Apneic episodes are not present. Epworth Sleepiness Score is 6.   ? ?2. SOB (shortness of breath) on exertion ?Krista Henson will continue activities. Krista Henson notes increasing shortness of breath with exercising and seems to be worsening over time with weight gain. She notes getting out of breath sooner with activity than she used to. This has not gotten worse recently. Krista Henson denies shortness of breath at rest or orthopnea.  ? ?3. Chronic migraine without aura without status migrainosus, not intractable ?Krista Henson is currently taking medication as needed.  ? ?4. PCOS (polycystic ovarian syndrome) ?Krista Henson has a history of thyroid nodule. She was diagnosed in 2012-2013 contoured with ultrasound. ? ?5. Lactose intolerance ?Krista Henson cannot drink regular milk and eat ice cream.  ? ?6. Irritable bowel syndrome with constipation ?Krista Henson is currently taking fiber. She notes constipation.  ? ?7. Elevated glucose ?Krista Henson is not on medications currently.  ? ?8. Vitamin D deficiency ?Krista Henson is taking multi-vitamins currently.  ? ?9. Elevated cholesterol ?Krista Henson is currently not on medications. ? ?Assessment/Plan:  ? ?1. Other fatigue ?Krista Henson will gradually increase her activities and exercise. We will check TSH today. Arwyn does feel  that her weight is causing her energy to be lower than it should be. Fatigue may be related to obesity, depression or many other causes. Labs will be ordered, and in the meanwhile, Jayanna will focus on self care including making healthy food choices, increasing physical activity and  focusing on stress reduction.  ? ?- EKG 12-Lead ?- TSH+T4F+T3Free ? ?2. SOB (shortness of breath) on exertion ?Krista Henson will gradually increase her activities and exercise. We will check TSH today. Birgit does feel that she gets out of breath more easily that she used to when she exercises. Evalette's shortness of breath appears to be obesity related and exercise induced. She has agreed to work on weight loss and gradually increase exercise to treat her exercise induced shortness of breath. Will continue to monitor closely. ?  ?- TSH+T4F+T3Free ? ?3. Chronic migraine without aura without status migrainosus, not intractable ?Krista Henson will continue her medications.  ? ?4. PCOS (polycystic ovarian syndrome) ?Krista Henson will lower her carbohydrates (sweets and starches). ?- Comprehensive metabolic panel ? ?5. Lactose intolerance ?Krista Henson will avoid milk and ice cream. We will check CMP today. ? ?- Comprehensive metabolic panel ? ?6. Irritable bowel syndrome with constipation ?Krista Henson will increase protein and water intake. She will drink Citrucel or Miralax. She will have Milk of Magnesium. We will check CMP today.  ? ?- Comprehensive metabolic panel ? ?7. Elevated glucose ?We will refill insulin and A1c. ? ?- Hemoglobin A1c ?- Insulin, random ? ?8. Vitamin D deficiency ?Low Vitamin D level contributes to fatigue and are associated with obesity, breast, and colon cancer. We will check Vitamin D today and Landrie will follow-up for routine testing of Vitamin D, at least 2-3 times per year to avoid over-replacement. ? ?- VITAMIN D 25 Hydroxy (Vit-D Deficiency, Fractures) ? ?9. Elevated cholesterol ?We will check Lipid panel today.  ? ?- Lipid Panel With LDL/HDL Ratio ? ?10. Depression screen ?Krista Henson had a positive depression screening. Depression is commonly associated with obesity and often results in emotional eating behaviors. We will monitor this closely and work on CBT to help improve the non-hunger eating patterns. Referral to  Psychology may be required if no improvement is seen as she continues in our clinic.  ? ?11. Class 1 obesity with serious comorbidity and body mass index (BMI) of 32.0 to 32.9 in adult, unspecified obesity type ?Krista Henson is currently in the action stage of change and her goal is to continue with weight loss efforts. I recommend Krista Henson begin the structured treatment plan as follows: ? ?She has agreed to the Category 2 Plan plus 100 calories. ? ?Krista Henson will have no sugary drinks. She will increase protein. Eating Out handout was provided today.  ? ?Exercise goals: No exercise has been prescribed at this time.  ? ?Behavioral modification strategies: increasing lean protein intake, decreasing simple carbohydrates, increasing vegetables, increasing water intake, decreasing eating out, no skipping meals, meal planning and cooking strategies, keeping healthy foods in the home, and planning for success. ? ?She was informed of the importance of frequent follow-up visits to maximize her success with intensive lifestyle modifications for her multiple health conditions. She was informed we would discuss her lab results at her next visit unless there is a critical issue that needs to be addressed sooner. Krista Henson agreed to keep her next visit at the agreed upon time to discuss these results. ? ?Objective:  ? ?Blood pressure 139/83, pulse 65, temperature 98.1 ?F (36.7 ?C), height 5\' 2"  (1.575 m), weight 177 lb (80.3 kg),  last menstrual period 09/10/2021, SpO2 99 %. Body mass index is 32.37 kg/m?. ? ?EKG: Normal sinus rhythm, rate 64 bpm. ? ?Indirect Calorimeter completed today shows a VO2 of 261 and a REE of 1800.  Her calculated basal metabolic rate is [id number] thus her basal metabolic rate is better than expected. ? ?General: Cooperative, alert, well developed, in no acute distress. ?HEENT: Conjunctivae and lids unremarkable. ?Cardiovascular: Regular rhythm.  ?Lungs: Normal work of breathing. ?Neurologic: No focal deficits.  ? ?Lab  Results  ?Component Value Date  ? CREATININE 0.75 09/21/2021  ? BUN 16 09/21/2021  ? NA 142 09/21/2021  ? K 4.8 09/21/2021  ? CL 101 09/21/2021  ? CO2 25 09/21/2021  ? ?Lab Results  ?Component Value Date  ? AL

## 2021-09-26 ENCOUNTER — Encounter (INDEPENDENT_AMBULATORY_CARE_PROVIDER_SITE_OTHER): Payer: Self-pay | Admitting: Bariatrics

## 2021-09-27 ENCOUNTER — Ambulatory Visit (INDEPENDENT_AMBULATORY_CARE_PROVIDER_SITE_OTHER): Payer: 59 | Admitting: Family Medicine

## 2021-10-02 ENCOUNTER — Telehealth (INDEPENDENT_AMBULATORY_CARE_PROVIDER_SITE_OTHER): Payer: 59 | Admitting: Psychology

## 2021-10-02 DIAGNOSIS — F32A Depression, unspecified: Secondary | ICD-10-CM | POA: Diagnosis not present

## 2021-10-02 DIAGNOSIS — F5089 Other specified eating disorder: Secondary | ICD-10-CM

## 2021-10-02 DIAGNOSIS — F419 Anxiety disorder, unspecified: Secondary | ICD-10-CM | POA: Diagnosis not present

## 2021-10-02 NOTE — Progress Notes (Signed)
?  Office: [phone]  /  Fax: [phone] ? ? ? ?Date: 10/16/2021   ?Appointment Start Time: 10:06am ?Duration: 22 minutes ?Provider: Lawerance Cruel, Psy.D. ?Type of Session: Individual Therapy  ?Location of Patient: Home (private location) ?Location of Provider: Provider's Home (private office) ?Type of Contact: Telepsychological Visit via MyChart Video Visit ? ?Session Content: Krista Henson is a 31 y.o. female presenting for a follow-up appointment to address the previously established treatment goal of increasing coping skills.Today's appointment was a telepsychological visit due to COVID-19. Krista Henson provided verbal consent for today's telepsychological appointment and she is aware she is responsible for securing confidentiality on her end of the session. Prior to proceeding with today's appointment, [name]'s physical location at the time of this appointment was obtained as well a phone number she could be reached at in the event of technical difficulties. Carriann and this provider participated in today's telepsychological service.  ? ?This provider conducted a brief check-in. [name] reported she had an initial appointment with [facility] for psychiatric services, noting she was prescribed Effexor and described it as helpful. She described a reduction in emotional/binge eating behaviors. Positive reinforcement was provided. Reviewed emotional and physical hunger. Psychoeducation regarding triggers for emotional eating was provided. [name] was provided a handout, and encouraged to utilize the handout between now and the next appointment to increase awareness of triggers and frequency. Krista Henson agreed. This provider also discussed behavioral strategies for specific triggers, such as placing the utensil down when conversing to avoid mindless eating. Margurite provided verbal consent during today's appointment for this provider to send a handout about triggers via e-mail. Overall, Krista Henson was receptive to today's  appointment as evidenced by openness to sharing, responsiveness to feedback, and willingness to explore triggers for emotional eating. ? ?Mental Status Examination:  ?Appearance: neat ?Behavior: appropriate to circumstances ?Mood: neutral ?Affect: mood congruent ?Speech: WNL ?Eye Contact: appropriate ?Psychomotor Activity: WNL ?Gait: unable to assess ?Thought Process: linear, logical, and goal directed and no evidence or endorsement of suicidal, homicidal, and self-harm ideation, plan and intent  ?Thought Content/Perception: no hallucinations, delusions, bizarre thinking or behavior endorsed or observed ?Orientation: AAOx4 ?Memory/Concentration: memory, attention, language, and fund of knowledge intact  ?Insight: good ?Judgment: good ? ?Interventions:  ?Conducted a brief chart review ?Provided empathic reflections and validation ?Reviewed content from the previous session ?Provided positive reinforcement ?Employed supportive psychotherapy interventions to facilitate reduced distress and to improve coping skills with identified stressors ?Psychoeducation provided regarding triggers for emotional eating behaviors ? ?DSM-5 Diagnosis(es):  F50.89 Other Specified Feeding or Eating Disorder, Emotional and Binge Eating Behaviors, F41.9 Unspecified Anxiety Disorder, and  F32.A Unspecified Depressive Disorder ? ?Treatment Goal & Progress: During the initial appointment with this provider, the following treatment goal was established: increase coping skills. Krista Henson has demonstrated some progress in her goal as evidenced by increased awareness of hunger patterns, reduction in emotional eating behaviors , and reduction in binge eating behaviors.  ? ?Plan: The next appointment will be scheduled in two weeks, which will be via MyChart Video Visit. The next session will focus on working towards the established treatment goal. Krista Henson has an appointment with [facility] on October 24, 2021, adding she will explore  therapeutic services during that appointment.  ? ?

## 2021-10-05 ENCOUNTER — Ambulatory Visit (INDEPENDENT_AMBULATORY_CARE_PROVIDER_SITE_OTHER): Payer: 59 | Admitting: Bariatrics

## 2021-10-05 ENCOUNTER — Encounter (INDEPENDENT_AMBULATORY_CARE_PROVIDER_SITE_OTHER): Payer: Self-pay | Admitting: Bariatrics

## 2021-10-05 VITALS — BP 139/80 | HR 88 | Temp 98.8°F | Ht 62.0 in | Wt 177.0 lb

## 2021-10-05 DIAGNOSIS — E6609 Other obesity due to excess calories: Secondary | ICD-10-CM

## 2021-10-05 DIAGNOSIS — E282 Polycystic ovarian syndrome: Secondary | ICD-10-CM

## 2021-10-05 DIAGNOSIS — Z6832 Body mass index (BMI) 32.0-32.9, adult: Secondary | ICD-10-CM

## 2021-10-05 DIAGNOSIS — G43709 Chronic migraine without aura, not intractable, without status migrainosus: Secondary | ICD-10-CM

## 2021-10-05 DIAGNOSIS — E669 Obesity, unspecified: Secondary | ICD-10-CM | POA: Diagnosis not present

## 2021-10-05 DIAGNOSIS — F509 Eating disorder, unspecified: Secondary | ICD-10-CM | POA: Diagnosis not present

## 2021-10-06 NOTE — Progress Notes (Signed)
? ? ? ?Chief Complaint:  ? ?OBESITY ?Krista Henson is here to discuss her progress with her obesity treatment plan along with follow-up of her obesity related diagnoses. Krista Henson is on the Category 2 Plan and states she is following her eating plan approximately 50-60% of the time. Krista Henson states she is doing yoga for 60 minutes 1 times per week and cardio for 40-50 minutes 1-2 times per week. ? ?Today's visit was #: 2 ?Starting weight: 177 lbs ?Starting date: 09/21/2021 ?Today's weight: 177 lbs ?Today's date: 10/05/2021 ?Total lbs lost to date: 0 ?Total lbs lost since last in-office visit: 0 ? ?Interim History: Awilda's weight remain the same since her first visit but according to the bioimpedance scale her total body fat percentage and fat mass won't down. ? ?Subjective:  ? ?1. PCOS (polycystic ovarian syndrome) ?Krista Henson is not on medications currently.  ? ?2. Chronic migraine without aura without status migrainosus, not intractable ?Krista Henson is currently taking Nurtec as needed. ? ?3.  Eating disorder, unspecified type ?Krista Henson is currently working with Dr. Dewaine Conger. ? ?Assessment/Plan:  ? ?1. PCOS (polycystic ovarian syndrome) ?Krista Henson will keep carbohydrates low (starches and sweets).  ? ?2. Chronic migraine without aura without status migrainosus, not intractable ?Krista Henson will continue medications as needed. ? ?3. Eating disorder, unspecified type ?Krista Henson will continue with Dr. Dewaine Conger. Behavior modification techniques were discussed today to help Krista Henson deal with her emotional/non-hunger eating behaviors.  Orders and follow up as documented in patient record.  ? ?4. Obesity, current BMI 32.4 ?Krista Henson is currently in the action stage of change. As such, her goal is to continue with weight loss efforts. She has agreed to the Category 2 Plan.  ? ?Krista Henson will continue meal planning and she will adhere closely to the plan 80-90%. We reviewed labs 09/21/2021 CMP, Lipids, Vitamin D, A1C, insulin and thyroid panel.  ? ?Exercise goals:  As  is.  ? ?Behavioral modification strategies: increasing lean protein intake, decreasing simple carbohydrates, increasing vegetables, increasing water intake, decreasing eating out, no skipping meals, meal planning and cooking strategies, keeping healthy foods in the home, and planning for success. ? ?Krista Henson has agreed to follow-up with our clinic in 2 weeks Krista Cliche, PA-C or William Hamburger, NP and 4-5 weeks with myself. She was informed of the importance of frequent follow-up visits to maximize her success with intensive lifestyle modifications for her multiple health conditions.  ? ?Objective:  ? ?Blood pressure 139/80, pulse 88, temperature 98.8 ?F (37.1 ?C), height 5\' 2"  (1.575 m), weight 177 lb (80.3 kg), last menstrual period 09/10/2021, SpO2 99 %. ?Body mass index is 32.37 kg/m?. ? ?General: Cooperative, alert, well developed, in no acute distress. ?HEENT: Conjunctivae and lids unremarkable. ?Cardiovascular: Regular rhythm.  ?Lungs: Normal work of breathing. ?Neurologic: No focal deficits.  ? ?Lab Results  ?Component Value Date  ? CREATININE 0.75 09/21/2021  ? BUN 16 09/21/2021  ? NA 142 09/21/2021  ? K 4.8 09/21/2021  ? CL 101 09/21/2021  ? CO2 25 09/21/2021  ? ?Lab Results  ?Component Value Date  ? ALT 24 09/21/2021  ? AST 16 09/21/2021  ? ALKPHOS 62 09/21/2021  ? BILITOT 0.3 09/21/2021  ? ?Lab Results  ?Component Value Date  ? HGBA1C 4.8 09/21/2021  ? ?Lab Results  ?Component Value Date  ? INSULIN 11.2 09/21/2021  ? ?Lab Results  ?Component Value Date  ? TSH 1.350 09/21/2021  ? ?Lab Results  ?Component Value Date  ? CHOL 197 09/21/2021  ? HDL 67 09/21/2021  ?  LDLCALC 117 (H) 09/21/2021  ? TRIG 70 09/21/2021  ? ?Lab Results  ?Component Value Date  ? VD25OH 40.3 09/21/2021  ? ?No results found for: WBC, HGB, HCT, MCV, PLT ?No results found for: IRON, TIBC, FERRITIN ? ?Attestation Statements:  ? ?Reviewed by clinician on day of visit: allergies, medications, problem list, medical history, surgical history,  family history, social history, and previous encounter notes. ? ?I, Lizbeth Bark, RMA, am acting as transcriptionist for [employer], DO. ? ?I have reviewed the above documentation for accuracy and completeness, and I agree with the above. Jearld Lesch, DO ? ?

## 2021-10-09 ENCOUNTER — Encounter (INDEPENDENT_AMBULATORY_CARE_PROVIDER_SITE_OTHER): Payer: Self-pay | Admitting: Bariatrics

## 2021-10-09 DIAGNOSIS — E6609 Other obesity due to excess calories: Secondary | ICD-10-CM | POA: Insufficient documentation

## 2021-10-16 ENCOUNTER — Telehealth (INDEPENDENT_AMBULATORY_CARE_PROVIDER_SITE_OTHER): Payer: 59 | Admitting: Psychology

## 2021-10-16 DIAGNOSIS — F419 Anxiety disorder, unspecified: Secondary | ICD-10-CM

## 2021-10-16 DIAGNOSIS — F5089 Other specified eating disorder: Secondary | ICD-10-CM | POA: Diagnosis not present

## 2021-10-16 DIAGNOSIS — F32A Depression, unspecified: Secondary | ICD-10-CM | POA: Diagnosis not present

## 2021-10-16 NOTE — Progress Notes (Signed)
?  Office: [phone]  /  Fax: [phone] ? ? ? ?Date: 10/30/2021   ?Appointment Start Time: 11:01am ?Duration: 27 minutes ?Provider: Glennie Isle, Psy.D. ?Type of Session: Individual Therapy  ?Location of Patient: Home (private location) ?Location of Provider: Provider's Home (private office) ?Type of Contact: Telepsychological Visit via MyChart Video Visit ? ?Session Content: Krista Henson is a 31 y.o. female presenting for a follow-up appointment to address the previously established treatment goal of increasing coping skills.Today's appointment was a telepsychological visit due to COVID-19. Krista Henson provided verbal consent for today's telepsychological appointment and she is aware she is responsible for securing confidentiality on her end of the session. Prior to proceeding with today's appointment, Krista Henson's physical location at the time of this appointment was obtained as well a phone number she could be reached at in the event of technical difficulties. Krista Henson and this provider participated in today's telepsychological service.  ? ?This provider conducted a brief check-in. Krista Henson stated she met with her psychiatric provider and she will be initiating therapeutic services in June with [place]. She noted her Effexor prescription was increased. Regarding weight loss, she described feeling "discouraged" and "frustrated." Further explored and processed. Krista Henson was engaged in problem solving to help her eat congruent to her structured meal plan (e.g., working plan for the day; doubling recipes; roasting vegetables in bulk). This provider and Krista Henson discussed habit planning by connecting a new habit with an existing habit. Remainder of session focused on other ways to measure success/progress with the clinic aside from the number on the scale (e.g., improvement in portion control, reduction in emotional and binge eating behaviors). Overall, Krista Henson was receptive to today's appointment as evidenced by  openness to sharing, responsiveness to feedback, and willingness to implement discussed strategies . ? ?Mental Status Examination:  ?Appearance: neat ?Behavior: appropriate to circumstances ?Mood: neutral ?Affect: mood congruent ?Speech: WNL ?Eye Contact: appropriate ?Psychomotor Activity: WNL ?Gait: unable to assess ?Thought Process: linear, logical, and goal directed and no evidence or endorsement of suicidal, homicidal, and self-harm ideation, plan and intent  ?Thought Content/Perception: no hallucinations, delusions, bizarre thinking or behavior endorsed or observed ?Orientation: AAOx4 ?Memory/Concentration: memory, attention, language, and fund of knowledge intact  ?Insight: fair ?Judgment: fair ? ?Interventions:  ?Conducted a brief chart review ?Provided empathic reflections and validation ?Employed supportive psychotherapy interventions to facilitate reduced distress and to improve coping skills with identified stressors ?Engaged patient in problem solving ? ?DSM-5 Diagnosis(es):  F50.89 Other Specified Feeding or Eating Disorder, Emotional and Binge Eating Behaviors, F41.9 Unspecified Anxiety Disorder, and  F32.A Unspecified Depressive Disorder ? ?Treatment Goal & Progress: During the initial appointment with this provider, the following treatment goal was established: increase coping skills. Krista Henson has demonstrated progress in her goal as evidenced by increased awareness of hunger patterns and increased awareness of triggers for emotional eating behaviors. She reported a reduction in emotional and binge eating behaviors.  ? ?Plan: The next appointment is scheduled for Nov 21, 2021 at 10am, which will be via [place] Visit. The next session will focus on working towards the established treatment goal. ? ?

## 2021-10-26 ENCOUNTER — Ambulatory Visit (INDEPENDENT_AMBULATORY_CARE_PROVIDER_SITE_OTHER): Payer: 59 | Admitting: Physician Assistant

## 2021-10-26 ENCOUNTER — Encounter (INDEPENDENT_AMBULATORY_CARE_PROVIDER_SITE_OTHER): Payer: Self-pay | Admitting: Physician Assistant

## 2021-10-26 VITALS — BP 117/81 | HR 58 | Temp 97.9°F | Ht 62.0 in | Wt 175.0 lb

## 2021-10-26 DIAGNOSIS — E559 Vitamin D deficiency, unspecified: Secondary | ICD-10-CM | POA: Diagnosis not present

## 2021-10-26 DIAGNOSIS — Z6832 Body mass index (BMI) 32.0-32.9, adult: Secondary | ICD-10-CM | POA: Diagnosis not present

## 2021-10-26 DIAGNOSIS — E669 Obesity, unspecified: Secondary | ICD-10-CM | POA: Diagnosis not present

## 2021-10-26 DIAGNOSIS — E66811 Obesity, class 1: Secondary | ICD-10-CM

## 2021-10-30 ENCOUNTER — Telehealth (INDEPENDENT_AMBULATORY_CARE_PROVIDER_SITE_OTHER): Payer: 59 | Admitting: Psychology

## 2021-10-30 DIAGNOSIS — F32A Depression, unspecified: Secondary | ICD-10-CM | POA: Diagnosis not present

## 2021-10-30 DIAGNOSIS — F419 Anxiety disorder, unspecified: Secondary | ICD-10-CM

## 2021-10-30 DIAGNOSIS — F5089 Other specified eating disorder: Secondary | ICD-10-CM

## 2021-10-30 NOTE — Progress Notes (Signed)
? ? ? ?Chief Complaint:  ? ?OBESITY ?Krista Henson is here to discuss her progress with her obesity treatment plan along with follow-up of her obesity related diagnoses. Krista Henson is on the Category 2 Plan and keeping a food journal and adhering to recommended goals of 1200 calories and 80 grams of protein and states she is following her eating plan approximately 80% of the time. Krista Henson states she is yoga and walking for 60 minutes 3 times per week. ? ?Today's visit was #: 3 ?Starting weight: 177 lbs ?Starting date: 09/21/2021 ?Today's weight: 175 lbs ?Today's date: 10/26/2021 ?Total lbs lost to date: 2 lbs ?Total lbs lost since last in-office visit: 2 lbs ? ?Interim History: Krista Henson is slightly frustrated with slowness of weight loss. She is averaging 70-90 grams of protein daily. She reports having to stay under 1200 calories in order to lose weight. According to the new guidelines she should be getting between 100-120 grams of protein daily. She is asking about weight loss medicines today. Her meal plan was reviewed in detail today.  ? ?Subjective:  ? ?1. Vitamin D deficiency ?Krista Henson is currently on multivitamins. Her last Vitamin D level was 40.3. ? ?Assessment/Plan:  ? ?1. Vitamin D deficiency ?Low Vitamin D level contributes to fatigue and are associated with obesity, breast, and colon cancer. Krista Henson will continue with multivitamin and she will follow-up for routine testing of Vitamin D, at least 2-3 times per year to avoid over-replacement. We will recheck Vitamin D level in 3 months.  ? ?2. Obesity, current BMI 32.4 ?Krista Henson is currently in the action stage of change. As such, her goal is to continue with weight loss efforts. She has agreed to keeping a food journal and adhering to recommended goals of 1200-1400 calories and 100-120 grams of protein daily.  ? ?We discussed appetite suppressants today.  ? ?Exercise goals:  As is. ? ?Behavioral modification strategies: increasing lean protein intake and meal planning and  cooking strategies. ? ?Krista Henson has agreed to follow-up with our clinic in 2 weeks. She was informed of the importance of frequent follow-up visits to maximize her success with intensive lifestyle modifications for her multiple health conditions.  ? ?Objective:  ? ?Blood pressure 117/81, pulse (!) 58, temperature 97.9 ?F (36.6 ?C), height 5\' 2"  (1.575 m), weight 175 lb (79.4 kg), SpO2 99 %. ?Body mass index is 32.01 kg/m?. ? ?General: Cooperative, alert, well developed, in no acute distress. ?HEENT: Conjunctivae and lids unremarkable. ?Cardiovascular: Regular rhythm.  ?Lungs: Normal work of breathing. ?Neurologic: No focal deficits.  ? ?Lab Results  ?Component Value Date  ? CREATININE 0.75 09/21/2021  ? BUN 16 09/21/2021  ? NA 142 09/21/2021  ? K 4.8 09/21/2021  ? CL 101 09/21/2021  ? CO2 25 09/21/2021  ? ?Lab Results  ?Component Value Date  ? ALT 24 09/21/2021  ? AST 16 09/21/2021  ? ALKPHOS 62 09/21/2021  ? BILITOT 0.3 09/21/2021  ? ?Lab Results  ?Component Value Date  ? HGBA1C 4.8 09/21/2021  ? ?Lab Results  ?Component Value Date  ? INSULIN 11.2 09/21/2021  ? ?Lab Results  ?Component Value Date  ? TSH 1.350 09/21/2021  ? ?Lab Results  ?Component Value Date  ? CHOL 197 09/21/2021  ? HDL 67 09/21/2021  ? LDLCALC 117 (H) 09/21/2021  ? TRIG 70 09/21/2021  ? ?Lab Results  ?Component Value Date  ? VD25OH 40.3 09/21/2021  ? ?No results found for: WBC, HGB, HCT, MCV, PLT ?No results found for: IRON, TIBC, FERRITIN ? ?  Attestation Statements:  ? ?Reviewed by clinician on day of visit: allergies, medications, problem list, medical history, surgical history, family history, social history, and previous encounter notes. ? ?Time spent on visit including pre-visit chart review and post-visit care and charting was 41 minutes.  ? ?I, Sindy Messing, am acting as Energy manager for [employer], PA-C. ? ?I have reviewed the above documentation for accuracy and completeness, and I agree with the above. Alois Cliche,  PA-C ? ?

## 2021-11-02 ENCOUNTER — Telehealth: Payer: Self-pay | Admitting: Adult Health

## 2021-11-02 NOTE — Telephone Encounter (Signed)
Faxed signed PA form with OV notes to [employer].  

## 2021-11-02 NOTE — Telephone Encounter (Signed)
Completed [employer] Botox PA form, placed in Nurse Pod for NP signature. ?

## 2021-11-20 ENCOUNTER — Encounter: Payer: Self-pay | Admitting: Adult Health

## 2021-11-20 ENCOUNTER — Ambulatory Visit (INDEPENDENT_AMBULATORY_CARE_PROVIDER_SITE_OTHER): Payer: 59 | Admitting: Bariatrics

## 2021-11-21 ENCOUNTER — Ambulatory Visit (INDEPENDENT_AMBULATORY_CARE_PROVIDER_SITE_OTHER): Payer: 59 | Admitting: Family Medicine

## 2021-11-21 ENCOUNTER — Encounter (INDEPENDENT_AMBULATORY_CARE_PROVIDER_SITE_OTHER): Payer: Self-pay | Admitting: Family Medicine

## 2021-11-21 ENCOUNTER — Telehealth (INDEPENDENT_AMBULATORY_CARE_PROVIDER_SITE_OTHER): Payer: 59 | Admitting: Psychology

## 2021-11-21 VITALS — BP 124/79 | HR 70 | Temp 97.7°F | Ht 62.0 in | Wt 179.0 lb

## 2021-11-21 DIAGNOSIS — E669 Obesity, unspecified: Secondary | ICD-10-CM | POA: Diagnosis not present

## 2021-11-21 DIAGNOSIS — E8881 Metabolic syndrome: Secondary | ICD-10-CM | POA: Diagnosis not present

## 2021-11-21 DIAGNOSIS — Z9189 Other specified personal risk factors, not elsewhere classified: Secondary | ICD-10-CM

## 2021-11-21 DIAGNOSIS — Z6832 Body mass index (BMI) 32.0-32.9, adult: Secondary | ICD-10-CM

## 2021-11-21 MED ORDER — METFORMIN HCL 500 MG PO TABS: ORAL_TABLET | ORAL | 0 refills | Status: DC

## 2021-11-29 NOTE — Progress Notes (Signed)
Chief Complaint:   OBESITY Krista Henson is here to discuss her progress with her obesity treatment plan along with follow-up of her obesity related diagnoses. Krista Henson is on keeping a food journal and adhering to recommended goals of 1200-1400 calories and 100-120 grams protein and states she is following her eating plan approximately 50% of the time. Krista Henson states she is doing yoga 60 minutes 1 times per week.  Today's visit was #: 4 Starting weight: 177 lbs Starting date: 09/21/2021 Today's weight: 179 lbs Today's date: 11/21/2021 Total lbs lost to date: 0 Total lbs lost since last in-office visit: +4  Interim History: This is Krista Henson's first visit with me. She sees Dr. Manson Passey. Pt was supposed to be on journaling plan but she is following category 2.  Subjective:   1. Insulin resistance, with carbohydrate cravings New. Discussed labs with patient today. Pt's A1c is 4.8 and fasting insulin 11.2. Pt has a history of PCOS.   2. At risk of diabetes mellitus Krista Henson is at higher than average risk for developing diabetes due to new onset insulin resistance.  Assessment/Plan:  No orders of the defined types were placed in this encounter.   There are no discontinued medications.   Meds ordered this encounter  Medications   metFORMIN (GLUCOPHAGE) 500 MG tablet    Sig: 1 po with lunch daily    Dispense:  30 tablet    Refill:  0    30 d supply;  ** OV for RF **   Do not send RF request     1. Insulin resistance, with carbohydrate cravings Krista Henson will continue to work on weight loss, exercise, and decreasing simple carbohydrates to help decrease the risk of diabetes. Krista Henson agreed to follow-up with [place] as directed to closely monitor her progress. Risks and benefits of Metformin discussed with pt. Handouts given on Insulin Resistance and Metformin. Disease counseling performed. Start metformin 1/2 tab with lunch for 4-6 days, and if tolerating well, increase to 1 tab daily.  Start- metFORMIN  (GLUCOPHAGE) 500 MG tablet; 1 po with lunch daily  Dispense: 30 tablet; Refill: 0  2. At risk of diabetes mellitus - Krista Henson was given diabetes prevention education and counseling today of more than 24 minutes.  - Counseled patient on pathophysiology of disease and meaning/ implication of lab results.  - Reviewed how certain foods can either stimulate or inhibit insulin release, and subsequently affect hunger pathways  - Importance of following a healthy meal plan with limiting amounts of simple carbohydrates discussed with patient - Effects of regular aerobic exercise on blood sugar regulation reviewed and encouraged an eventual goal of 30 min 5d/week or more as a minimum.  - Briefly discussed treatment options, which always include dietary and lifestyle modification as first line.   - Handouts provided at patient's desire and/or told to go online to the American Diabetes Association website for further information.  3. Obesity, current BMI 32.8 Krista Henson is currently in the action stage of change. As such, her goal is to continue with weight loss efforts. She has agreed to keeping a food journal and adhering to recommended goals of 1200-1300 calories and 100+ grams protein.   Pt will use category 2 as a guide, but will journal everything and bring in summary log at next OV.  Exercise goals:  As is  Behavioral modification strategies: increasing lean protein intake, decreasing simple carbohydrates, increasing water intake, and avoiding temptations.  Krista Henson has agreed to follow-up with our clinic in 2  weeks. She was informed of the importance of frequent follow-up visits to maximize her success with intensive lifestyle modifications for her multiple health conditions.   Objective:   Blood pressure 124/79, pulse 70, temperature 97.7 F (36.5 C), height 5\' 2"  (1.575 m), weight 179 lb (81.2 kg), SpO2 99 %. Body mass index is 32.74 kg/m.  General: Cooperative, alert, well developed, in no acute  distress. HEENT: Conjunctivae and lids unremarkable. Cardiovascular: Regular rhythm.  Lungs: Normal work of breathing. Neurologic: No focal deficits.   Lab Results  Component Value Date   CREATININE 0.75 09/21/2021   BUN 16 09/21/2021   NA 142 09/21/2021   K 4.8 09/21/2021   CL 101 09/21/2021   CO2 25 09/21/2021   Lab Results  Component Value Date   ALT 24 09/21/2021   AST 16 09/21/2021   ALKPHOS 62 09/21/2021   BILITOT 0.3 09/21/2021   Lab Results  Component Value Date   HGBA1C 4.8 09/21/2021   Lab Results  Component Value Date   INSULIN 11.2 09/21/2021   Lab Results  Component Value Date   TSH 1.350 09/21/2021   Lab Results  Component Value Date   CHOL 197 09/21/2021   HDL 67 09/21/2021   LDLCALC 117 (H) 09/21/2021   TRIG 70 09/21/2021   Lab Results  Component Value Date   VD25OH 40.3 09/21/2021    Attestation Statements:   Reviewed by clinician on day of visit: allergies, medications, problem list, medical history, surgical history, family history, social history, and previous encounter notes.  I, 09/23/2021, BS, CMA, am acting as transcriptionist for Kyung Rudd, DO.  I have reviewed the above documentation for accuracy and completeness, and I agree with the above. [employer], D.O.  The 21st Century Cures Act was signed into law in 2016 which includes the topic of electronic health records.  This provides immediate access to information in MyChart.  This includes consultation notes, operative notes, office notes, lab results and pathology reports.  If you have any questions about what you read please let 2017 know at your next visit so we can discuss your concerns and take corrective action if need be.  We are right here with you.

## 2021-12-04 ENCOUNTER — Ambulatory Visit: Payer: 59 | Admitting: Adult Health

## 2021-12-05 ENCOUNTER — Encounter (INDEPENDENT_AMBULATORY_CARE_PROVIDER_SITE_OTHER): Payer: Self-pay | Admitting: Family Medicine

## 2021-12-06 NOTE — Telephone Encounter (Signed)
Received approval from [place], [place] # [id number] (12/05/2021-12/05/2022).

## 2021-12-07 ENCOUNTER — Encounter (INDEPENDENT_AMBULATORY_CARE_PROVIDER_SITE_OTHER): Payer: Self-pay | Admitting: Family Medicine

## 2021-12-07 ENCOUNTER — Ambulatory Visit (INDEPENDENT_AMBULATORY_CARE_PROVIDER_SITE_OTHER): Payer: 59 | Admitting: Family Medicine

## 2021-12-07 VITALS — BP 103/70 | HR 76 | Temp 97.9°F | Ht 62.0 in | Wt 183.0 lb

## 2021-12-07 DIAGNOSIS — Z6833 Body mass index (BMI) 33.0-33.9, adult: Secondary | ICD-10-CM | POA: Diagnosis not present

## 2021-12-07 DIAGNOSIS — E669 Obesity, unspecified: Secondary | ICD-10-CM

## 2021-12-07 DIAGNOSIS — E8881 Metabolic syndrome: Secondary | ICD-10-CM | POA: Diagnosis not present

## 2021-12-07 DIAGNOSIS — Z9189 Other specified personal risk factors, not elsewhere classified: Secondary | ICD-10-CM

## 2021-12-07 MED ORDER — METFORMIN HCL 500 MG PO TABS: 500.0000 mg | ORAL_TABLET | Freq: Two times a day (BID) | ORAL | 0 refills | Status: DC

## 2021-12-10 NOTE — Progress Notes (Signed)
  Office: [phone]  /  Fax: [phone]    Date: 12/15/2021   Appointment Start Time: 2:31pm Duration: 27 minutes Provider: Lawerance Cruel, Psy.D. Type of Session: Individual Therapy  Location of Patient: Home (private location) Location of Provider: Provider's Home (private office) Type of Contact: Telepsychological Visit via MyChart Video Visit  Session Content: Krista Henson is a 31 y.o. female presenting for a follow-up appointment to address the previously established treatment goal of increasing coping skills.Today's appointment was a telepsychological visit due to COVID-19. [name] provided verbal consent for today's telepsychological appointment and she is aware she is responsible for securing confidentiality on her end of the session. Prior to proceeding with today's appointment, [name]'s physical location at the time of this appointment was obtained as well a phone number she could be reached at in the event of technical difficulties. Kadeisha and this provider participated in today's telepsychological service.   Per [employer] new policy, [name] will be e-mailed two additional forms (AOB and Receipt of NPP) to sign as well as [employer]'s Notice of Privacy Practices. The content of the documents were explained to [place]. Additionally, she agreed to complete the forms and return them prior to the next appointment with this provider.   This provider conducted a brief check-in. [name] discussed increased challenges with "staying on track in the evenings." Further explored and processed. Despite challenges, she reported making better choices, implementing previously discussed strategies, and finding what works for her. Positive reinforcement was provided. [name] described experiencing frustration regarding weight loss to date despite aforementioned efforts. As such, this provider and [name] discussed other ways to measure success aside form the number on the scale. Notably, she reported a reduction in  emotional and binge eating behaviors. Remainder of today's appointment focused on mindless eating, as she indicated she eats to help her focus. Overall, [name] was receptive to today's appointment as evidenced by openness to sharing and responsiveness to feedback.  Mental Status Examination:  Appearance: neat Behavior: appropriate to circumstances Mood: neutral Affect: mood congruent Speech: WNL Eye Contact: appropriate Psychomotor Activity: WNL Gait: unable to assess Thought Process: linear, logical, and goal directed and no evidence or endorsement of suicidal, homicidal, and self-harm ideation, plan and intent  Thought Content/Perception: no hallucinations, delusions, bizarre thinking or behavior endorsed or observed Orientation: AAOx4 Memory/Concentration: memory, attention, language, and fund of knowledge intact  Insight: good Judgment: fair  Interventions:  Conducted a brief chart review Provided empathic reflections and validation Employed supportive psychotherapy interventions to facilitate reduced distress and to improve coping skills with identified stressors Engaged patient in problem solving  DSM-5 Diagnosis(es):  F50.89 Other Specified Feeding or Eating Disorder, Emotional and Binge Eating Behaviors, F41.9 Unspecified Anxiety Disorder, and  F32.A Unspecified Depressive Disorder  Treatment Goal & Progress: During the initial appointment with this provider, the following treatment goal was established: increase coping skills. [name] has demonstrated progress in her goal as evidenced by increased awareness of hunger patterns and increased awareness of triggers for emotional eating behaviors. [name] also continues to demonstrate willingness to engage in learned skill(s).  Plan: The next appointment is scheduled for 01/09/2022 at 4pm, which will be via MyChart Video Visit. The next session will focus on working towards the established treatment goal. [name] will complete and return  forms.

## 2021-12-12 DIAGNOSIS — Z9189 Other specified personal risk factors, not elsewhere classified: Secondary | ICD-10-CM | POA: Insufficient documentation

## 2021-12-12 DIAGNOSIS — E8881 Metabolic syndrome: Secondary | ICD-10-CM | POA: Insufficient documentation

## 2021-12-13 ENCOUNTER — Other Ambulatory Visit (INDEPENDENT_AMBULATORY_CARE_PROVIDER_SITE_OTHER): Payer: Self-pay | Admitting: Family Medicine

## 2021-12-13 DIAGNOSIS — E8881 Metabolic syndrome: Secondary | ICD-10-CM

## 2021-12-16 NOTE — Progress Notes (Unsigned)
Chief Complaint:   OBESITY Krista Henson is here to discuss her progress with her obesity treatment plan along with follow-up of her obesity related diagnoses. Krista Henson is supposed to be keeping a food journal and adhering to recommended goals of 1200-1300 calories and 100+ protein, but has been following Category 2 and states she is following her eating plan approximately 40-50% of the time. Krista Henson states she is doing yoga(2 days per week), walking  15 minutes 7 times per week.  Today's visit was #: 5 Starting weight: 177 lbs Starting date: 09/21/2021 Today's weight: 179 lbs Today's date: 12/07/2021 Total lbs lost to date: 0 Total lbs lost since last in-office visit: 0  Interim History: Krista Henson has journaled and is consistently over her calories by 300-500 calories each day.  She is hitting her protein goals.  In the evening she is doing more snacking and has unhealthy items.  She is up 4 lbs today.   Subjective:   1. Insulin resistance, with carbohydrate cravings Krista Henson started Metformin last office visit and has been tolerating it well.  Krista Henson reports that it has curved her appetite some but after a few days she felt like it was of little help.  Fasting insulin level was 11.2 about 2 months ago.    2. At risk for side effect of medication Krista Henson is at risk for drug side effects due to increased dose of medication today.    Assessment/Plan:  No orders of the defined types were placed in this encounter.   Medications Discontinued During This Encounter  Medication Reason   metFORMIN (GLUCOPHAGE) 500 MG tablet Reorder     Meds ordered this encounter  Medications   metFORMIN (GLUCOPHAGE) 500 MG tablet    Sig: Take 1 tablet (500 mg total) by mouth 2 (two) times daily with a meal. (Lunch and dinner)    Dispense:  60 tablet    Refill:  0    30 d supply;  ** OV for RF **   Do not send RF request     1. Insulin resistance, with carbohydrate cravings Increase Metformin to 500 mg  twice a day (up from 1 daily).  Risks and benefits discussed today.  Continue prudent nutritional plan and weight loss,    Refill- metFORMIN (GLUCOPHAGE) 500 MG tablet; Take 1 tablet (500 mg total) by mouth 2 (two) times daily with a meal. (Lunch and dinner)  Dispense: 60 tablet; Refill: 0  2. At risk for side effect of medication Krista Henson was given approximately 15 minutes of drug side effect counseling today.  We discussed side effect possibility and risk versus benefits. Krista Henson agreed to the medication and will contact this office if these side effects are intolerable.  Repetitive spaced learning was employed today to elicit superior memory formation and behavioral change.   3. Obesity, Current BMI 33.5 1) Only eat at table, not in front of computer. 2)  Start getting rid of "empty calories" in diet.  Krista Henson is currently in the action stage of change. As such, her goal is to continue with weight loss efforts. She has agreed to keeping a food journal and adhering to recommended goals of 1200-1300 calories and 100+ protein.   Exercise goals:  Increase to 20 minutes daily if possible.   Behavioral modification strategies: ways to avoid night time snacking and avoiding temptations.  Krista Henson has agreed to follow-up with our clinic in 3 weeks. She was informed of the importance of frequent follow-up visits to maximize her success  with intensive lifestyle modifications for her multiple health conditions.   Objective:   Blood pressure 103/70, pulse 76, temperature 97.9 F (36.6 C), height 5\' 2"  (1.575 m), weight 183 lb (83 kg), SpO2 100 %. Body mass index is 33.47 kg/m.  General: Cooperative, alert, well developed, in no acute distress. HEENT: Conjunctivae and lids unremarkable. Cardiovascular: Regular rhythm.  Lungs: Normal work of breathing. Neurologic: No focal deficits.   Lab Results  Component Value Date   CREATININE 0.75 09/21/2021   BUN 16 09/21/2021   NA 142 09/21/2021   K 4.8  09/21/2021   CL 101 09/21/2021   CO2 25 09/21/2021   Lab Results  Component Value Date   ALT 24 09/21/2021   AST 16 09/21/2021   ALKPHOS 62 09/21/2021   BILITOT 0.3 09/21/2021   Lab Results  Component Value Date   HGBA1C 4.8 09/21/2021   Lab Results  Component Value Date   INSULIN 11.2 09/21/2021   Lab Results  Component Value Date   TSH 1.350 09/21/2021   Lab Results  Component Value Date   CHOL 197 09/21/2021   HDL 67 09/21/2021   LDLCALC 117 (H) 09/21/2021   TRIG 70 09/21/2021   Lab Results  Component Value Date   VD25OH 40.3 09/21/2021   No results found for: "WBC", "HGB", "HCT", "MCV", "PLT" No results found for: "IRON", "TIBC", "FERRITIN"   Attestation Statements:   Reviewed by clinician on day of visit: allergies, medications, problem list, medical history, surgical history, family history, social history, and previous encounter notes.  I, 09/23/2021, RMA, am acting as [employer] for [employer] manager, DO.  I have reviewed the above documentation for accuracy and completeness, and I agree with the above. [employer], D.O.  The 21st Century Cures Act was signed into law in 2016 which includes the topic of electronic health records.  This provides immediate access to information in MyChart.  This includes consultation notes, operative notes, office notes, lab results and pathology reports.  If you have any questions about what you read please let 2017 know at your next visit so we can discuss your concerns and take corrective action if need be.  We are right here with you.

## 2021-12-18 ENCOUNTER — Telehealth (INDEPENDENT_AMBULATORY_CARE_PROVIDER_SITE_OTHER): Payer: 59 | Admitting: Psychology

## 2021-12-18 ENCOUNTER — Ambulatory Visit: Payer: 59 | Admitting: Adult Health

## 2021-12-18 DIAGNOSIS — F419 Anxiety disorder, unspecified: Secondary | ICD-10-CM | POA: Diagnosis not present

## 2021-12-18 DIAGNOSIS — F32A Depression, unspecified: Secondary | ICD-10-CM

## 2021-12-18 DIAGNOSIS — F5089 Other specified eating disorder: Secondary | ICD-10-CM | POA: Diagnosis not present

## 2021-12-21 ENCOUNTER — Encounter: Payer: Self-pay | Admitting: Adult Health

## 2021-12-21 ENCOUNTER — Ambulatory Visit (INDEPENDENT_AMBULATORY_CARE_PROVIDER_SITE_OTHER): Payer: 59 | Admitting: Adult Health

## 2021-12-21 DIAGNOSIS — G43709 Chronic migraine without aura, not intractable, without status migrainosus: Secondary | ICD-10-CM

## 2021-12-21 NOTE — Progress Notes (Signed)
   12/21/2021:  09/06/21: Reports that migraines have decreased since she started Botox therapy.    BOTOX PROCEDURE NOTE FOR MIGRAINE HEADACHE    Contraindications and precautions discussed with patient(above). Aseptic procedure was observed and patient tolerated procedure. Procedure performed by Butch Penny, NP  The condition has existed for more than 6 months, and pt does not have a diagnosis of ALS, Myasthenia Gravis or Lambert-Eaton Syndrome.  Risks and benefits of injections discussed and pt agrees to proceed with the procedure.  Written consent obtained  These injections are medically necessary.  These injections do not cause sedations or hallucinations which the oral therapies may cause.  Indication/Diagnosis: chronic migraine BOTOX(J0585) injection was performed according to protocol by [employer]. 200 units of BOTOX was dissolved into 4 cc NS.   NDC: [address]-44  Type of toxin: Botox  Botox- 200 units x 1 vial [id number] Expiration: 08/2024 NDC: 4403-4742-59   Bacteriostatic 0.9% Sodium Chloride- 31mL total Lot: [id number] Expiration: 01/31/2023 NDC: 6433-2951-88   Dx: C16.606 Description of procedure:  The patient was placed in a sitting position. The standard protocol was used for Botox as follows, with 5 units of Botox injected at each site:   -Procerus muscle, midline injection  -Corrugator muscle, bilateral injection  -Frontalis muscle, bilateral injection, with 2 sites each side, medial injection was performed in the upper one third of the frontalis muscle, in the region vertical from the medial inferior edge of the superior orbital rim. The lateral injection was again in the upper one third of the forehead vertically above the lateral limbus of the cornea, 1.5 cm lateral to the medial injection site. 1 unit placed between slighty below two injections.   -Temporalis muscle injection, 4 sites, bilaterally. The first injection was 3 cm above the tragus of the ear,  second injection site was 1.5 cm to 3 cm up from the first injection site in line with the tragus of the ear. The third injection site was 1.5-3 cm forward between the first 2 injection sites. The fourth injection site was 1.5 cm posterior to the second injection site.  -Occipitalis muscle injection, 3 sites, bilaterally. The first injection was done one half way between the occipital protuberance and the tip of the mastoid process behind the ear. The second injection site was done lateral and superior to the first, 1 fingerbreadth from the first injection. The third injection site was 1 fingerbreadth superiorly and medially from the first injection site.  -Cervical paraspinal muscle injection, 3 sites, bilateral knee first injection site was 1 cm from the midline of the cervical spine, 3 cm inferior to the lower border of the occipital protuberance. The second injection site was 1.5 cm superiorly and laterally to the first injection site. Third injection 1 cm above second injection   -Trapezius muscle deferred- patient refused   Will return for repeat injection in 3 months.   A 200 units of Botox was used, 137 units were injected, the rest of the Botox was wasted. The patient tolerated the procedure well, there were no complications of the above procedure.  Butch Penny, MSN, NP-C 12/21/2021, 3:32 PM [facility] [address], [address] [place], [place] [address] [phone]

## 2021-12-21 NOTE — Progress Notes (Signed)
Botox- 200 units x 1 vial Lot: [id number] Expiration: 08/2024 NDC: 0023-3921-02  Bacteriostatic 0.9% Sodium Chloride- 4mL total Lot: [id number] Expiration: 01/31/2023 NDC: 0409-1966-02  Dx: G43.709 B/B  

## 2021-12-26 NOTE — Progress Notes (Signed)
  Office: [phone]  /  Fax: [phone]    Date: 01/09/2022   Appointment Start Time: 4:02pm Duration: 26 minutes Provider: Lawerance Cruel, Psy.D. Type of Session: Individual Therapy  Location of Patient: Home (private location) Location of Provider: Provider's Home (private office) Type of Contact: Telepsychological Visit via MyChart Video Visit  Session Content: Krista Henson is a 31 y.o. female presenting for a follow-up appointment to address the previously established treatment goal of increasing coping skills.Today's appointment was a telepsychological visit. Krista Henson provided verbal consent for today's telepsychological appointment and she is aware she is responsible for securing confidentiality on her end of the session. Prior to proceeding with today's appointment, Krista Henson's physical location at the time of this appointment was obtained as well a phone number she could be reached at in the event of technical difficulties. Krista Henson and this provider participated in today's telepsychological service.   This provider conducted a brief check-in. Krista Henson reported, "I've been doing really good staying on my diet plan." She also noted weight loss and a reduction in cravings. Reviewed triggers for emotional eating behaviors. Krista Henson was engaged in problem solving to develop a plan to further help cope with urges/cravings involving activities to relax, activities to distract, comforting places, people to call and connect with, and activities that help soothe senses. She was observed writing the plan. Overall, Krista Henson was receptive to today's appointment as evidenced by openness to sharing, responsiveness to feedback, and willingness to implement discussed strategies .  Mental Status Examination:  Appearance: neat Behavior: appropriate to circumstances Mood: neutral Affect: mood congruent Speech: WNL Eye Contact: appropriate Psychomotor Activity: WNL Gait: unable to assess Thought Process: linear,  logical, and goal directed and no evidence or endorsement of suicidal, homicidal, and self-harm ideation, plan and intent  Thought Content/Perception: no hallucinations, delusions, bizarre thinking or behavior endorsed or observed Orientation: AAOx4 Memory/Concentration: memory, attention, language, and fund of knowledge intact  Insight: good Judgment: good  Interventions:  Conducted a brief chart review Provided empathic reflections and validation Reviewed content from the previous session Provided positive reinforcement Employed supportive psychotherapy interventions to facilitate reduced distress and to improve coping skills with identified stressors Engaged patient in problem solving  DSM-5 Diagnosis(es):  F50.89 Other Specified Feeding or Eating Disorder, Emotional and Binge Eating Behaviors, F41.9 Unspecified Anxiety Disorder, and  F32.A Unspecified Depressive Disorder  Treatment Goal & Progress: During the initial appointment with this provider, the following treatment goal was established: increase coping skills. Krista Henson has demonstrated progress in her goal as evidenced by increased awareness of hunger patterns, increased awareness of triggers for emotional eating behaviors, and reduction in emotional eating behaviors . Krista Henson also continues to demonstrate willingness to engage in learned skill(s).  Plan: The next appointment is scheduled for 02/06/2022 at 4pm, which will be via MyChart Video Visit. The next session will focus on working towards the established treatment goal. Krista Henson's initial appointment with [facility] for therapeutic services is on 01/15/2022.

## 2021-12-28 ENCOUNTER — Encounter (INDEPENDENT_AMBULATORY_CARE_PROVIDER_SITE_OTHER): Payer: Self-pay | Admitting: Family Medicine

## 2021-12-28 ENCOUNTER — Ambulatory Visit (INDEPENDENT_AMBULATORY_CARE_PROVIDER_SITE_OTHER): Payer: 59 | Admitting: Family Medicine

## 2021-12-28 VITALS — BP 107/75 | HR 72 | Temp 98.0°F | Ht 62.0 in | Wt 179.0 lb

## 2021-12-28 DIAGNOSIS — E8881 Metabolic syndrome: Secondary | ICD-10-CM | POA: Diagnosis not present

## 2021-12-28 DIAGNOSIS — E669 Obesity, unspecified: Secondary | ICD-10-CM

## 2021-12-28 DIAGNOSIS — Z6832 Body mass index (BMI) 32.0-32.9, adult: Secondary | ICD-10-CM | POA: Diagnosis not present

## 2021-12-28 DIAGNOSIS — Z9189 Other specified personal risk factors, not elsewhere classified: Secondary | ICD-10-CM

## 2021-12-28 MED ORDER — METFORMIN HCL 500 MG PO TABS: 500.0000 mg | ORAL_TABLET | Freq: Two times a day (BID) | ORAL | 0 refills | Status: DC

## 2021-12-28 NOTE — Progress Notes (Signed)
Chief Complaint:   OBESITY Krista Henson is here to discuss her progress with her obesity treatment plan along with follow-up of her obesity related diagnoses. Krista Henson is on keeping a food journal and adhering to recommended goals of 1200-1300 calories and 100+ protein and states she is following her eating plan approximately 80% of the time. Krista Henson states she is walking and doing yoga 15-60 minutes 2-7 times per week.  Today's visit was #: 6 Starting weight: 177 lbs Starting date: 09/21/2021 Today's weight: 179 lbs Today's date: 12/28/2021 Total lbs lost to date: 0 Total lbs lost since last in-office visit: 4 lbs  Interim History: Krista Henson went to [place] [place] on vacation.  When she got back she was mindfully eating more proteins and has less cravings.  Subjective:   1. Insulin resistance, with carbohydrate cravings Goal is HgbA1c < 5.7, fasting insulin closer to 5.   Increasing metformin last office visit has helped a lot with cravings, mental obsessions, treats, and certain foods.  Started metformin on 11/21/21 and it was increased on 12/07/2021.  2. At risk for impaired metabolic function Krista Henson is at increased risk for impaired metabolic function due to current nutrition and muscle mass.   Assessment/Plan:  No orders of the defined types were placed in this encounter.   Medications Discontinued During This Encounter  Medication Reason   venlafaxine XR (EFFEXOR-XR) 37.5 MG 24 hr capsule    metFORMIN (GLUCOPHAGE) 500 MG tablet Reorder     Meds ordered this encounter  Medications   metFORMIN (GLUCOPHAGE) 500 MG tablet    Sig: Take 1 tablet (500 mg total) by mouth 2 (two) times daily with a meal. (Lunch and dinner)    Dispense:  60 tablet    Refill:  0    30 d supply;  ** OV for RF **   Do not send RF request     1. Insulin resistance, with carbohydrate cravings Krista Henson will continue to work on weight loss, exercise, and decreasing simple carbohydrates to help decrease the risk  of diabetes. Krista Henson agreed to follow-up with [place] as directed to closely monitor her progress.   Refill- metFORMIN (GLUCOPHAGE) 500 MG tablet; Take 1 tablet (500 mg total) by mouth 2 (two) times daily with a meal. (Lunch and dinner)  Dispense: 60 tablet; Refill: 0  2. At risk for impaired metabolic function Krista Henson was given approximately 9 minutes of impaired  metabolic function prevention counseling today. We discussed intensive lifestyle modifications today with an emphasis on specific nutrition and exercise instructions and strategies.   Repetitive spaced learning was employed today to elicit superior memory formation and behavioral change.   3. Obesity, Current BMI 32.9 Bring in journal log next office visit.  Krista Henson is currently in the action stage of change. As such, her goal is to continue with weight loss efforts. She has agreed to keeping a food journal and adhering to recommended goals of 1200-1300 calories and 100+ protein.   Exercise goals: For substantial health benefits, adults should do at least 150 minutes (2 hours and 30 minutes) a week of moderate-intensity, or 75 minutes (1 hour and 15 minutes) a week of vigorous-intensity aerobic physical activity, or an equivalent combination of moderate- and vigorous-intensity aerobic activity. Aerobic activity should be performed in episodes of at least 10 minutes, and preferably, it should be spread throughout the week.  Behavioral modification strategies: increasing lean protein intake, increasing water intake, avoiding temptations, and keeping a strict food journal.  Krista Henson has agreed  to follow-up with our clinic in 3 weeks. She was informed of the importance of frequent follow-up visits to maximize her success with intensive lifestyle modifications for her multiple health conditions.   Objective:   Blood pressure 107/75, pulse 72, temperature 98 F (36.7 C), height 5\' 2"  (1.575 m), weight 179 lb (81.2 kg), SpO2 99 %. Body mass index  is 32.74 kg/m.  General: Cooperative, alert, well developed, in no acute distress. HEENT: Conjunctivae and lids unremarkable. Cardiovascular: Regular rhythm.  Lungs: Normal work of breathing. Neurologic: No focal deficits.   Lab Results  Component Value Date   CREATININE 0.75 09/21/2021   BUN 16 09/21/2021   NA 142 09/21/2021   K 4.8 09/21/2021   CL 101 09/21/2021   CO2 25 09/21/2021   Lab Results  Component Value Date   ALT 24 09/21/2021   AST 16 09/21/2021   ALKPHOS 62 09/21/2021   BILITOT 0.3 09/21/2021   Lab Results  Component Value Date   HGBA1C 4.8 09/21/2021   Lab Results  Component Value Date   INSULIN 11.2 09/21/2021   Lab Results  Component Value Date   TSH 1.350 09/21/2021   Lab Results  Component Value Date   CHOL 197 09/21/2021   HDL 67 09/21/2021   LDLCALC 117 (H) 09/21/2021   TRIG 70 09/21/2021   Lab Results  Component Value Date   VD25OH 40.3 09/21/2021   No results found for: "WBC", "HGB", "HCT", "MCV", "PLT" No results found for: "IRON", "TIBC", "FERRITIN"   Attestation Statements:   Reviewed by clinician on day of visit: allergies, medications, problem list, medical history, surgical history, family history, social history, and previous encounter notes.  I, 09/23/2021, RMA, am acting as [employer] for [employer] manager, DO.   I have reviewed the above documentation for accuracy and completeness, and I agree with the above. [employer], D.O.  The 21st Century Cures Act was signed into law in 2016 which includes the topic of electronic health records.  This provides immediate access to information in MyChart.  This includes consultation notes, operative notes, office notes, lab results and pathology reports.  If you have any questions about what you read please let 2017 know at your next visit so we can discuss your concerns and take corrective action if need be.  We are right here with you.

## 2021-12-30 ENCOUNTER — Other Ambulatory Visit (INDEPENDENT_AMBULATORY_CARE_PROVIDER_SITE_OTHER): Payer: Self-pay | Admitting: Family Medicine

## 2021-12-30 DIAGNOSIS — E8881 Metabolic syndrome: Secondary | ICD-10-CM

## 2022-01-09 ENCOUNTER — Telehealth (INDEPENDENT_AMBULATORY_CARE_PROVIDER_SITE_OTHER): Payer: 59 | Admitting: Psychology

## 2022-01-09 DIAGNOSIS — F419 Anxiety disorder, unspecified: Secondary | ICD-10-CM | POA: Diagnosis not present

## 2022-01-09 DIAGNOSIS — F5089 Other specified eating disorder: Secondary | ICD-10-CM

## 2022-01-09 DIAGNOSIS — F32A Depression, unspecified: Secondary | ICD-10-CM

## 2022-01-17 ENCOUNTER — Other Ambulatory Visit (INDEPENDENT_AMBULATORY_CARE_PROVIDER_SITE_OTHER): Payer: Self-pay | Admitting: Family Medicine

## 2022-01-17 DIAGNOSIS — E8881 Metabolic syndrome: Secondary | ICD-10-CM

## 2022-01-23 ENCOUNTER — Encounter (INDEPENDENT_AMBULATORY_CARE_PROVIDER_SITE_OTHER): Payer: Self-pay | Admitting: Family Medicine

## 2022-01-23 ENCOUNTER — Ambulatory Visit (INDEPENDENT_AMBULATORY_CARE_PROVIDER_SITE_OTHER): Payer: 59 | Admitting: Family Medicine

## 2022-01-23 VITALS — BP 118/82 | HR 88 | Temp 98.1°F | Ht 62.0 in | Wt 177.0 lb

## 2022-01-23 DIAGNOSIS — Z9189 Other specified personal risk factors, not elsewhere classified: Secondary | ICD-10-CM

## 2022-01-23 DIAGNOSIS — E669 Obesity, unspecified: Secondary | ICD-10-CM

## 2022-01-23 DIAGNOSIS — E8881 Metabolic syndrome: Secondary | ICD-10-CM

## 2022-01-23 DIAGNOSIS — Z6832 Body mass index (BMI) 32.0-32.9, adult: Secondary | ICD-10-CM | POA: Diagnosis not present

## 2022-01-23 MED ORDER — METFORMIN HCL 500 MG PO TABS: 500.0000 mg | ORAL_TABLET | Freq: Two times a day (BID) | ORAL | 0 refills | Status: DC

## 2022-01-23 NOTE — Progress Notes (Signed)
  Office: [phone]  /  Fax: [phone]    Date: 02/06/2022   Appointment Start Time: 4:02pm Duration: 25 minutes Provider: Lawerance Cruel, Psy.D. Type of Session: Individual Therapy  Location of Patient: Home (private location) Location of Provider: Provider's Home (private office) Type of Contact: Telepsychological Visit via MyChart Video Visit  Session Content: Krista Henson is a 31 y.o. female presenting for a follow-up appointment to address the previously established treatment goal of increasing coping skills.Today's appointment was a telepsychological visit. Krista Henson provided verbal consent for today's telepsychological appointment and she is aware she is responsible for securing confidentiality on her end of the session. Prior to proceeding with today's appointment, Krista Henson's physical location at the time of this appointment was obtained as well a phone number she could be reached at in the event of technical difficulties. Krista Henson and this provider participated in today's telepsychological service.   This provider conducted a brief check-in. Amrit shared about recent events, including finishing a class and having more down time. Regarding eating habits, she discussed she is making better choices and continues to report a reduction in emotional and binge eating behaviors. She also reported an improvement in mood. Krista Henson further shared about her recent appointments with [facility], adding, "I really like the therapist." She also indicated a plan to complete an assessment to ascertain if she meets criteria for ADHD. Psychoeducation regarding challenges related to ADHD-related concerns and emotional/binge eating was provided. Moreover, Krista Henson discussed utilizing the plan developed at the last appointment has helped her cope with emotional/binge eating urges. Positive reinforcement was provided. This provider and Krista Henson discussed strategies for traveling to ensure she continues to make  better choices and engage in portion control. Furthermore, termination planning was discussed. Krista Henson was receptive to a follow-up appointment in 3-4 weeks and an additional follow-up/termination appointment in 3-4 weeks after that. Overall, Krista Henson was receptive to today's appointment as evidenced by openness to sharing, responsiveness to feedback, and willingness to implement discussed strategies .  Mental Status Examination:  Appearance: neat Behavior: appropriate to circumstances Mood: neutral Affect: mood congruent Speech: WNL Eye Contact: appropriate Psychomotor Activity: WNL Gait: unable to assess Thought Process: linear, logical, and goal directed and no evidence or endorsement of suicidal, homicidal, and self-harm ideation, plan and intent  Thought Content/Perception: no hallucinations, delusions, bizarre thinking or behavior endorsed or observed Orientation: AAOx4 Memory/Concentration: memory, attention, language, and fund of knowledge intact  Insight: good Judgment: good  Interventions:  Conducted a brief chart review Provided empathic reflections and validation Reviewed content from the previous session Provided positive reinforcement Employed supportive psychotherapy interventions to facilitate reduced distress and to improve coping skills with identified stressors Discussed termination planning  DSM-5 Diagnosis(es):  F50.89 Other Specified Feeding or Eating Disorder, Emotional and Binge Eating Behaviors, F41.9 Unspecified Anxiety Disorder, and  F32.A Unspecified Depressive Disorder  Treatment Goal & Progress: During the initial appointment with this provider, the following treatment goal was established: increase coping skills. Krista Henson has demonstrated progress in her goal as evidenced by increased awareness of hunger patterns, increased awareness of triggers for emotional eating behaviors, reduction in emotional eating behaviors , and reduction in binge eating behaviors.  Krista Henson also continues to demonstrate willingness to engage in learned skill(s).  Plan: The next appointment is scheduled for 02/26/2022 at 4pm, which will be via MyChart Video Visit. The next session will focus on working towards the established treatment goal. Krista Henson will continue meeting with her primary therapist every Friday.

## 2022-01-27 NOTE — Progress Notes (Signed)
Chief Complaint:   OBESITY Krista Henson is here to discuss her progress with her obesity treatment plan along with follow-up of her obesity related diagnoses. Krista Henson is on keeping a food journal and adhering to recommended goals of 1200-1300 calories and 100+ grams protein and states she is following her eating plan approximately 80% of the time. Krista Henson states she is not currently exercising.  Today's visit was #: 7 Starting weight: 177 lbs Starting date: 09/21/2021 Today's weight: 177 lbs Today's date: 01/23/2022 Total lbs lost to date: 0 Total lbs lost since last in-office visit: 2  Interim History: Dillon brought in her journaling log today. She had an ear infection and was unable to eat much for several days. She has been very low on calories and protein intake per day. Calories occasionally are between 1800-1900 and protein is 40-50 grams.  Subjective:   1. Insulin resistance, with carbohydrate cravings Krista Henson is tolerating Metformin well. She has more regular bowel movements and is not constipated anymore, but she is still only drinking 50 oz of water per day. Metformin works great to decrease pt's cravings.  2. At risk for impaired metabolic function Krista Henson is at risk for impaired metabolic function due to insulin resistance.  Assessment/Plan:  No orders of the defined types were placed in this encounter.   Medications Discontinued During This Encounter  Medication Reason   metFORMIN (GLUCOPHAGE) 500 MG tablet Reorder     Meds ordered this encounter  Medications   metFORMIN (GLUCOPHAGE) 500 MG tablet    Sig: Take 1 tablet (500 mg total) by mouth 2 (two) times daily with a meal. (Lunch and dinner)    Dispense:  60 tablet    Refill:  0    30 d supply;  ** OV for RF **   Do not send RF request     1. Insulin resistance, with carbohydrate cravings Krista Henson will continue to work on weight loss, exercise, and decreasing simple carbohydrates to help decrease the risk of  diabetes. Krista Henson agreed to follow-up with [place] as directed to closely monitor her progress.  Refill- metFORMIN (GLUCOPHAGE) 500 MG tablet; Take 1 tablet (500 mg total) by mouth 2 (two) times daily with a meal. (Lunch and dinner)  Dispense: 60 tablet; Refill: 0  2. At risk for impaired metabolic function Due to Krista Henson's current state of health and medical condition(s), she is at a significantly higher risk for impaired metabolic function.   At least 9 minutes was spent on counseling Krista Henson about these concerns today.  This places the patient at a much greater risk to subsequently develop cardio-pulmonary conditions that can negatively affect the patient's quality of life.  I stressed the importance of reversing these risks factors.  The initial goal is to lose at least 5-10% of starting weight to help reduce risk factors.  Counseling:  Intensive lifestyle modifications discussed with Krista Henson as the most appropriate first line treatment.  she will continue to work on diet, exercise, and weight loss efforts.  We will continue to reassess these conditions on a fairly regular basis in an attempt to decrease the patient's overall morbidity and mortality.  3. Obesity, Current BMI 32.4 Jury is currently in the action stage of change. As such, her goal is to continue with weight loss efforts. She has agreed to keeping a food journal and adhering to recommended goals of 1200-1300 calories and 100+ grams protein.   Continue with healthy habits. Journal to hit goals 1-2 days a week, at  first, and increase water intake.  Exercise goals:  As is  Behavioral modification strategies: increasing lean protein intake and decreasing simple carbohydrates.  Galilee has agreed to follow-up with our clinic in 2 weeks. She was informed of the importance of frequent follow-up visits to maximize her success with intensive lifestyle modifications for her multiple health conditions.   Objective:   Blood pressure 118/82, pulse  88, temperature 98.1 F (36.7 C), height 5\' 2"  (1.575 m), weight 177 lb (80.3 kg), SpO2 99 %. Body mass index is 32.37 kg/m.  General: Cooperative, alert, well developed, in no acute distress. HEENT: Conjunctivae and lids unremarkable. Cardiovascular: Regular rhythm.  Lungs: Normal work of breathing. Neurologic: No focal deficits.   Lab Results  Component Value Date   CREATININE 0.75 09/21/2021   BUN 16 09/21/2021   NA 142 09/21/2021   K 4.8 09/21/2021   CL 101 09/21/2021   CO2 25 09/21/2021   Lab Results  Component Value Date   ALT 24 09/21/2021   AST 16 09/21/2021   ALKPHOS 62 09/21/2021   BILITOT 0.3 09/21/2021   Lab Results  Component Value Date   HGBA1C 4.8 09/21/2021   Lab Results  Component Value Date   INSULIN 11.2 09/21/2021   Lab Results  Component Value Date   TSH 1.350 09/21/2021   Lab Results  Component Value Date   CHOL 197 09/21/2021   HDL 67 09/21/2021   LDLCALC 117 (H) 09/21/2021   TRIG 70 09/21/2021   Lab Results  Component Value Date   VD25OH 40.3 09/21/2021    Attestation Statements:   Reviewed by clinician on day of visit: allergies, medications, problem list, medical history, surgical history, family history, social history, and previous encounter notes.  I, 09/23/2021, BS, CMA, am acting as transcriptionist for Kyung Rudd, DO.   I have reviewed the above documentation for accuracy and completeness, and I agree with the above. [employer], D.O.  The 21st Century Cures Act was signed into law in 2016 which includes the topic of electronic health records.  This provides immediate access to information in MyChart.  This includes consultation notes, operative notes, office notes, lab results and pathology reports.  If you have any questions about what you read please let 2017 know at your next visit so we can discuss your concerns and take corrective action if need be.  We are right here with you.

## 2022-02-05 NOTE — Telephone Encounter (Signed)
I called [facility] at [phone] spoke with Marlon Pel reference # [id number]. She states we are needing to send the primary EOB for DOS 11/24/2020. She gave a fax number to the claims department at [phone], claim # [id number].

## 2022-02-06 ENCOUNTER — Telehealth (INDEPENDENT_AMBULATORY_CARE_PROVIDER_SITE_OTHER): Payer: 59 | Admitting: Psychology

## 2022-02-06 DIAGNOSIS — F32A Depression, unspecified: Secondary | ICD-10-CM | POA: Diagnosis not present

## 2022-02-06 DIAGNOSIS — F419 Anxiety disorder, unspecified: Secondary | ICD-10-CM

## 2022-02-06 DIAGNOSIS — F5089 Other specified eating disorder: Secondary | ICD-10-CM

## 2022-02-07 ENCOUNTER — Encounter (INDEPENDENT_AMBULATORY_CARE_PROVIDER_SITE_OTHER): Payer: Self-pay

## 2022-02-21 ENCOUNTER — Other Ambulatory Visit (INDEPENDENT_AMBULATORY_CARE_PROVIDER_SITE_OTHER): Payer: Self-pay | Admitting: Family Medicine

## 2022-02-21 ENCOUNTER — Encounter (INDEPENDENT_AMBULATORY_CARE_PROVIDER_SITE_OTHER): Payer: Self-pay

## 2022-02-21 ENCOUNTER — Ambulatory Visit (INDEPENDENT_AMBULATORY_CARE_PROVIDER_SITE_OTHER): Payer: 59 | Admitting: Family Medicine

## 2022-02-21 DIAGNOSIS — E8881 Metabolic syndrome: Secondary | ICD-10-CM

## 2022-02-26 ENCOUNTER — Ambulatory Visit (INDEPENDENT_AMBULATORY_CARE_PROVIDER_SITE_OTHER): Payer: 59 | Admitting: Family Medicine

## 2022-02-26 ENCOUNTER — Telehealth (INDEPENDENT_AMBULATORY_CARE_PROVIDER_SITE_OTHER): Payer: 59 | Admitting: Psychology

## 2022-02-26 ENCOUNTER — Encounter (INDEPENDENT_AMBULATORY_CARE_PROVIDER_SITE_OTHER): Payer: Self-pay | Admitting: Family Medicine

## 2022-02-26 VITALS — BP 111/75 | HR 82 | Temp 98.5°F | Ht 62.0 in | Wt 179.0 lb

## 2022-02-26 DIAGNOSIS — F3289 Other specified depressive episodes: Secondary | ICD-10-CM | POA: Diagnosis not present

## 2022-02-26 DIAGNOSIS — E559 Vitamin D deficiency, unspecified: Secondary | ICD-10-CM | POA: Diagnosis not present

## 2022-02-26 DIAGNOSIS — F5089 Other specified eating disorder: Secondary | ICD-10-CM | POA: Diagnosis not present

## 2022-02-26 DIAGNOSIS — Z6832 Body mass index (BMI) 32.0-32.9, adult: Secondary | ICD-10-CM

## 2022-02-26 DIAGNOSIS — E8881 Metabolic syndrome: Secondary | ICD-10-CM | POA: Diagnosis not present

## 2022-02-26 DIAGNOSIS — F32A Depression, unspecified: Secondary | ICD-10-CM

## 2022-02-26 DIAGNOSIS — E669 Obesity, unspecified: Secondary | ICD-10-CM

## 2022-02-26 DIAGNOSIS — E785 Hyperlipidemia, unspecified: Secondary | ICD-10-CM

## 2022-02-26 DIAGNOSIS — F419 Anxiety disorder, unspecified: Secondary | ICD-10-CM

## 2022-02-26 MED ORDER — METFORMIN HCL 500 MG PO TABS: 500.0000 mg | ORAL_TABLET | Freq: Two times a day (BID) | ORAL | 0 refills | Status: DC

## 2022-02-26 NOTE — Progress Notes (Signed)
  Office: [phone]  /  Fax: [phone]    Date: February 26, 2022    Appointment Start Time: 4:02pm Duration: 24 minutes Provider: Lawerance Cruel, Psy.D. Type of Session: Individual Therapy  Location of Patient: Home (private location) Location of Provider: Provider's Home (private office) Type of Contact: Telepsychological Visit via MyChart Video Visit  Session Content: Krista Henson is a 31 y.o. female presenting for a follow-up appointment to address the previously established treatment goal of increasing coping skills.Today's appointment was a telepsychological visit. Krista Henson provided verbal consent for today's telepsychological appointment and she is aware she is responsible for securing confidentiality on her end of the session. Prior to proceeding with today's appointment, Krista Henson's physical location at the time of this appointment was obtained as well a phone number she could be reached at in the event of technical difficulties. Krista Henson and this provider participated in today's telepsychological service.   This provider conducted a brief check-in. Krista Henson shared, "It's been a little bit rough." She explained she is not regularly journaling due to ongoing stressors, but is trying to make better choices. Krista Henson also discussed recent instances of emotional eating behaviors secondary to work-related stressors. Further explored and processed. Psychoeducation regarding mindfulness was provided to assist with coping. A handout was provided to [name] with further information regarding mindfulness, including exercises. This provider also explained the benefit of mindfulness as it relates to emotional eating. Krista Henson was encouraged to engage in the provided exercises between now and the next appointment with this provider. Krista Henson agreed. During today's appointment, Krista Henson was led through a mindfulness exercise involving her senses. Krista Henson provided verbal consent during today's appointment for this provider to  send a handout about mindfulness via e-mail. Overall, Krista Henson was receptive to today's appointment as evidenced by openness to sharing, responsiveness to feedback, and willingness to engage in mindfulness exercises to assist with coping.  Mental Status Examination:  Appearance: neat Behavior: appropriate to circumstances Mood: sad Affect: mood congruent Speech: WNL Eye Contact: appropriate Psychomotor Activity: WNL Gait: unable to assess Thought Process: linear, logical, and goal directed and no evidence or endorsement of suicidal, homicidal, and self-harm ideation, plan and intent  Thought Content/Perception: no hallucinations, delusions, bizarre thinking or behavior endorsed or observed Orientation: AAOx4 Memory/Concentration: memory, attention, language, and fund of knowledge intact  Insight: good Judgment: fair  Interventions:  Conducted a brief chart review Provided empathic reflections and validation Employed supportive psychotherapy interventions to facilitate reduced distress and to improve coping skills with identified stressors Psychoeducation provided regarding mindfulness Engaged patient in mindfulness exercise(s)  DSM-5 Diagnosis(es):  F50.89 Other Specified Feeding or Eating Disorder, Emotional and Binge Eating Behaviors, F41.9 Unspecified Anxiety Disorder, and  F32.A Unspecified Depressive Disorder  Treatment Goal & Progress: During the initial appointment with this provider, the following treatment goal was established: increase coping skills. Krista Henson has demonstrated progress in her goal as evidenced by increased awareness of hunger patterns, increased awareness of triggers for emotional eating behaviors, reduction in emotional eating behaviors , and reduction in binge eating behaviors. Krista Henson also continues to demonstrate willingness to engage in learned skill(s).  Plan: The next appointment is scheduled for 03/19/2022 at 4pm, which will be via MyChart Video Visit. The  next session will focus on working towards the established treatment goal. Krista Henson will continue with her primary therapist.

## 2022-02-27 LAB — INSULIN, RANDOM: INSULIN: 12.6 u[IU]/mL (ref 2.6–24.9)

## 2022-02-27 LAB — LIPID PANEL
Chol/HDL Ratio: 2.9 ratio (ref 0.0–4.4)
Cholesterol, Total: 191 mg/dL (ref 100–199)
HDL: 66 mg/dL (ref >39)
LDL Chol Calc (NIH): 112 mg/dL — ABNORMAL HIGH (ref 0–99)
Triglycerides: 71 mg/dL (ref 0–149)
VLDL Cholesterol Cal: 13 mg/dL (ref 5–40)

## 2022-02-27 LAB — VITAMIN D 25 HYDROXY (VIT D DEFICIENCY, FRACTURES): Vit D, 25-Hydroxy: 49.5 ng/mL (ref 30.0–100.0)

## 2022-02-27 LAB — HEMOGLOBIN A1C
Est. average glucose Bld gHb Est-mCnc: 97 mg/dL
Hgb A1c MFr Bld: 5 % (ref 4.8–5.6)

## 2022-03-05 NOTE — Progress Notes (Signed)
Chief Complaint:   OBESITY Krista Henson is here to discuss her progress with her obesity treatment plan along with follow-up of her obesity related diagnoses. [name] is on keeping a food journal and adhering to recommended goals of 1200-1300 calories and 100+ grams protein and states she is following her eating plan approximately 70% of the time. [name] states she is doing Yoga 60 minutes 3 times per week.  Today's visit was #: 8 Starting weight: 177 lbs Starting date: 09/21/2021 Today's weight: 179 lbs Today's date: 02/26/2022 Total lbs lost to date: 0 Total lbs lost since last in-office visit: +2  Interim History: [name] went away on a work trip for 3 days and didn't gain any weight during that time. Since then, she has had increased stress at work with lay-offs; not her but with friends. She has done more emotional eating lately.  Subjective:   1. Insulin resistance, with carbohydrate cravings Not at goal. Goal is HgbA1c < 5.7, fasting insulin closer to 5.  Medication: Metformin.    2. Vitamin D deficiency [name] takes a multivitamin with Vit D in it daily. On 09/21/2021, her Bit D level was 40.3.  3. Other depression, emotional eating binges Pt has been struggling lately due to increased stressors, but she has been working with Dr. Dewaine Conger.  4. Hyperlipidemia, unspecified hyperlipidemia type She has had an elevated LDL in the past and has not been on meds. Pt has been eating much better and desires recheck.  Assessment/Plan:   Orders Placed This Encounter  Procedures   Hemoglobin A1c   Insulin, random   Lipid panel   VITAMIN D 25 Hydroxy (Vit-D Deficiency, Fractures)    Medications Discontinued During This Encounter  Medication Reason   metFORMIN (GLUCOPHAGE) 500 MG tablet Reorder     Meds ordered this encounter  Medications   metFORMIN (GLUCOPHAGE) 500 MG tablet    Sig: Take 1 tablet (500 mg total) by mouth 2 (two) times daily with a meal. (Lunch and dinner)     Dispense:  60 tablet    Refill:  0    30 d supply;  ** OV for RF **   Do not send RF request     1. Insulin resistance, with carbohydrate cravings Zaharah will continue to work on weight loss, exercise, and decreasing simple carbohydrates to help decrease the risk of diabetes. Flora agreed to follow-up with [place] as directed to closely monitor her progress.  Refill- metFORMIN (GLUCOPHAGE) 500 MG tablet; Take 1 tablet (500 mg total) by mouth 2 (two) times daily with a meal. (Lunch and dinner)  Dispense: 60 tablet; Refill: 0  Lab/Orders today: - Hemoglobin A1c - Insulin, random  2. Vitamin D deficiency Low Vitamin D level contributes to fatigue and are associated with obesity, breast, and colon cancer. She agrees to continue the multivitamin daily and will follow-up for routine testing of Vitamin D, at least 2-3 times per year to avoid over-replacement.  Lab/Orders today: - VITAMIN D 25 Hydroxy (Vit-D Deficiency, Fractures)  3. Other depression, emotional eating binges Behavior modification techniques were discussed today to help [name] deal with her emotional/non-hunger eating behaviors.  Orders and follow up as documented in patient record. [name] is working with Dr. Dewaine Conger and will continue. Also seek out ADD evaluation with PCP or [facility].  4. Hyperlipidemia, unspecified hyperlipidemia type Cardiovascular risk and specific lipid/LDL goals reviewed.  We discussed several lifestyle modifications today and [name] will continue to work on diet, exercise and weight  loss efforts. Orders and follow up as documented in patient record.  Continue prudent nutritional plan and decrease saturated and trans fats.  Counseling Intensive lifestyle modifications are the first line treatment for this issue. Dietary changes: Increase soluble fiber. Decrease simple carbohydrates. Exercise changes: Moderate to vigorous-intensity aerobic activity 150 minutes per week if  tolerated. Lipid-lowering medications: see documented in medical record.  Lab/Orders today: - Lipid panel  5. Obesity, Current BMI 32.8 Leaha is currently in the action stage of change. As such, her goal is to continue with weight loss efforts. She has agreed to keeping a food journal and adhering to recommended goals of 1200-1300 calories and 100+ grams protein.   Go to PCP or [facility] for evaluation. Bring in journaling log to next OV.  Exercise goals:  As is  Behavioral modification strategies: increasing lean protein intake and planning for success.  [name] has agreed to follow-up with our clinic in 3-4 weeks. She was informed of the importance of frequent follow-up visits to maximize her success with intensive lifestyle modifications for her multiple health conditions.   [name] was informed we would discuss her lab results at her next visit unless there is a critical issue that needs to be addressed sooner. [name] agreed to keep her next visit at the agreed upon time to discuss these results.  Objective:   Blood pressure 111/75, pulse 82, temperature 98.5 F (36.9 C), height 5\' 2"  (1.575 m), weight 179 lb (81.2 kg), SpO2 99 %. Body mass index is 32.74 kg/m.  General: Cooperative, alert, well developed, in no acute distress. HEENT: Conjunctivae and lids unremarkable. Cardiovascular: Regular rhythm.  Lungs: Normal work of breathing. Neurologic: No focal deficits.   Lab Results  Component Value Date   CREATININE 0.75 09/21/2021   BUN 16 09/21/2021   NA 142 09/21/2021   K 4.8 09/21/2021   CL 101 09/21/2021   CO2 25 09/21/2021   Lab Results  Component Value Date   ALT 24 09/21/2021   AST 16 09/21/2021   ALKPHOS 62 09/21/2021   BILITOT 0.3 09/21/2021   Lab Results  Component Value Date   HGBA1C 5.0 02/26/2022   HGBA1C 4.8 09/21/2021   Lab Results  Component Value Date   INSULIN 12.6 02/26/2022   INSULIN 11.2 09/21/2021   Lab Results   Component Value Date   TSH 1.350 09/21/2021   Lab Results  Component Value Date   CHOL 191 02/26/2022   HDL 66 02/26/2022   LDLCALC 112 (H) 02/26/2022   TRIG 71 02/26/2022   CHOLHDL 2.9 02/26/2022   Lab Results  Component Value Date   VD25OH 49.5 02/26/2022   VD25OH 40.3 09/21/2021   Attestation Statements:   Reviewed by clinician on day of visit: allergies, medications, problem list, medical history, surgical history, family history, social history, and previous encounter notes.  Time spent on visit including pre-visit chart review and post-visit care and charting was 40 minutes.   I, 09/23/2021, BS, CMA, am acting as transcriptionist for Kyung Rudd, DO.   I have reviewed the above documentation for accuracy and completeness, and I agree with the above. [employer], D.O.  The 21st Century Cures Act was signed into law in 2016 which includes the topic of electronic health records.  This provides immediate access to information in MyChart.  This includes consultation notes, operative notes, office notes, lab results and pathology reports.  If you have any questions about what you read please let 2017 know at  your next visit so we can discuss your concerns and take corrective action if need be.  We are right here with you.

## 2022-03-15 ENCOUNTER — Ambulatory Visit: Payer: 59 | Admitting: Adult Health

## 2022-03-15 DIAGNOSIS — G43709 Chronic migraine without aura, not intractable, without status migrainosus: Secondary | ICD-10-CM

## 2022-03-15 MED ORDER — ONABOTULINUMTOXINA 200 UNITS IJ SOLR
155.0000 [IU] | Freq: Once | INTRAMUSCULAR | Status: AC
  Administered 2022-03-15: 139 [IU] via INTRAMUSCULAR

## 2022-03-15 NOTE — Progress Notes (Signed)
Botox- 200 units x 1 vial Lot: [id number] Expiration: 08/2024 NDC: 0023-3921-02  Bacteriostatic 0.9% Sodium Chloride- 4mL total Lot: [id number] Expiration: 01/31/2023 NDC: 0409-1966-02  Dx: G43.709  B/B  

## 2022-03-15 NOTE — Progress Notes (Signed)
   03/15/22: more headaches this last month. Fortunately they have been mild.  Wondering if she should increase atenolol.  She is already had a good dose.  May consider adding on Qulipta in the future  09/06/21: Reports that migraines have decreased since she started Botox therapy.    BOTOX PROCEDURE NOTE FOR MIGRAINE HEADACHE    Contraindications and precautions discussed with patient(above). Aseptic procedure was observed and patient tolerated procedure. Procedure performed by Butch Penny, NP  The condition has existed for more than 6 months, and pt does not have a diagnosis of ALS, Myasthenia Gravis or Lambert-Eaton Syndrome.  Risks and benefits of injections discussed and pt agrees to proceed with the procedure.  Written consent obtained  These injections are medically necessary.  These injections do not cause sedations or hallucinations which the oral therapies may cause.  Indication/Diagnosis: chronic migraine BOTOX(J0585) injection was performed according to protocol by [employer]. 200 units of BOTOX was dissolved into 4 cc NS.   NDC: [address]-70  Type of toxin: Botox  Botox- 200 units x 1 vial Lot: [id number] Expiration: 08/2024 NDC: 9675-9163-84   Bacteriostatic 0.9% Sodium Chloride- 65mL total Lot: [id number] Expiration: 01/31/2023 NDC: 6659-9357-01   Dx: X79.390 Description of procedure:  The patient was placed in a sitting position. The standard protocol was used for Botox as follows, with 5 units of Botox injected at each site:   -Procerus muscle, midline injection  -Corrugator muscle, bilateral injection  -Frontalis muscle, bilateral injection, with 2 sites each side, medial injection was performed in the upper one third of the frontalis muscle, in the region vertical from the medial inferior edge of the superior orbital rim. The lateral injection was again in the upper one third of the forehead vertically above the lateral limbus of the cornea, 1.5 cm lateral to the  medial injection site. 1 unit placed between slighty below two injections.   -Temporalis muscle injection, 4 sites, bilaterally. The first injection was 3 cm above the tragus of the ear, second injection site was 1.5 cm to 3 cm up from the first injection site in line with the tragus of the ear. The third injection site was 1.5-3 cm forward between the first 2 injection sites. The fourth injection site was 1.5 cm posterior to the second injection site.  -Occipitalis muscle injection, 3 sites, bilaterally. The first injection was done one half way between the occipital protuberance and the tip of the mastoid process behind the ear. The second injection site was done lateral and superior to the first, 1 fingerbreadth from the first injection. The third injection site was 1 fingerbreadth superiorly and medially from the first injection site.  -Cervical paraspinal muscle injection, 2 sites, bilateral  first injection site was 1 cm from the midline of the cervical spine, 3 cm inferior to the lower border of the occipital protuberance. The second injection site was 1.5 cm superiorly and laterally to the first injection site.   -Trapezius muscle deferred- patient refused   Will return for repeat injection in 3 months.   A 200 units of Botox was used, 139 units were injected, the rest of the Botox was wasted. The patient tolerated the procedure well, there were no complications of the above procedure.  Butch Penny, MSN, NP-C 03/15/2022, 9:52 AM [facility] [address] [place], [place] [address] [phone]

## 2022-03-19 ENCOUNTER — Telehealth (INDEPENDENT_AMBULATORY_CARE_PROVIDER_SITE_OTHER): Payer: 59 | Admitting: Psychology

## 2022-03-19 DIAGNOSIS — F5089 Other specified eating disorder: Secondary | ICD-10-CM

## 2022-03-19 DIAGNOSIS — F32A Depression, unspecified: Secondary | ICD-10-CM

## 2022-03-19 DIAGNOSIS — F909 Attention-deficit hyperactivity disorder, unspecified type: Secondary | ICD-10-CM

## 2022-03-19 DIAGNOSIS — F419 Anxiety disorder, unspecified: Secondary | ICD-10-CM | POA: Diagnosis not present

## 2022-03-19 NOTE — Progress Notes (Signed)
Office: [phone]  /  Fax: [phone]    Date: March 19, 2022    Appointment Start Time: 4:02pm Duration: 33 minutes Provider: Glennie Isle, Psy.D. Type of Session: Individual Therapy  Location of Patient: Home (private location) Location of Provider: Provider's Home (private office) Type of Contact: Telepsychological Visit via MyChart Video Visit  Session Content: Krista Henson is a 31 y.o. female presenting for a follow-up appointment to address the previously established treatment goal of increasing coping skills.Today's appointment was a telepsychological visit. Krista Henson provided verbal consent for today's telepsychological appointment and she is aware she is responsible for securing confidentiality on her end of the session. Prior to proceeding with today's appointment, Krista Henson's physical location at the time of this appointment was obtained as well a phone number she could be reached at in the event of technical difficulties. Krista Henson and this provider participated in today's telepsychological service.   This provider conducted a brief check-in. Krista Henson indicated experiencing low motivation and depressed mood when changes were made to her Wellbutrin rx. Further explored and processed. Krista Henson discussed a desire to pursue an ADHD evaluation, but discussed challenges finding a provider. Krista Henson shared experiencing "decision paralysis," "executive dysfunction," "dissociation," difficulty prioritizing tasks, and other ADHD-related concerns. As such, her psychiatric provider reportedly altered her  Wellbutrin dose in the hope it would help with the aforementioned symptoms until an evaluation could be completed. This provider discussed a referral option. Krista Henson of today's appointment focused on problem solving to help Krista Henson make better choices and engage in portion control, as she endorsed recent engagement in emotional eating behaviors. She noted a plan to cook lean protein in bulk for multiple  meals; have frozen meals for busy days; and have designated snack areas with choices that are portioned and ideally have protein. She was encouraged to utilize visual reminders to assist with the aforementioned plan. She agreed. Overall, Krista Henson was receptive to today's appointment as evidenced by openness to sharing, responsiveness to feedback, and willingness to implement discussed strategies .  Mental Status Examination:  Appearance: neat Behavior: appropriate to circumstances Mood: sad Affect: mood congruent; tearful  Speech: WNL Eye Contact: appropriate Psychomotor Activity: WNL Gait: unable to assess Thought Process: linear, logical, and goal directed and no evidence or endorsement of suicidal, homicidal, and self-harm ideation, plan and intent  Thought Content/Perception: no hallucinations, delusions, bizarre thinking or behavior endorsed or observed Orientation: AAOx4 Memory/Concentration: memory, attention, language, and fund of knowledge intact  Insight: fair Judgment: fair  Interventions:  Conducted a brief chart review Provided empathic reflections and validation Employed supportive psychotherapy interventions to facilitate reduced distress and to improve coping skills with identified stressors Engaged patient in problem solving  DSM-5 Diagnosis(es): F50.89 Other Specified Feeding or Eating Disorder, Emotional and Binge Eating Behaviors, F41.9 Unspecified Anxiety Disorder, F32.A Unspecified Depressive Disorder, and F90.9 Unspecified Attention-Deficit/Hyperactivity Disorder   Treatment Goal & Progress: During the initial appointment with this provider, the following treatment goal was established: increase coping skills. Krista Henson has demonstrated progress in her goal as evidenced by increased awareness of hunger patterns, increased awareness of triggers for emotional eating behaviors, reduction in emotional eating behaviors , and reduction in binge eating behaviors. Krista Henson also  continues to demonstrate willingness to engage in learned skill(s).  Plan: Due to recent challenges, the next appointment is scheduled for 04/03/2022 at 8:30am, which will be via MyChart Video Visit. The next session will focus on working towards the established treatment goal. Krista Henson will continue with her primary therapist. Moreover, Krista Henson provided verbal consent  for this provider to place a referral with  Behavioral Medicine for an ADHD evaluation.

## 2022-03-21 ENCOUNTER — Encounter (INDEPENDENT_AMBULATORY_CARE_PROVIDER_SITE_OTHER): Payer: Self-pay | Admitting: Family Medicine

## 2022-03-21 ENCOUNTER — Ambulatory Visit (INDEPENDENT_AMBULATORY_CARE_PROVIDER_SITE_OTHER): Payer: 59 | Admitting: Family Medicine

## 2022-03-21 VITALS — BP 118/75 | HR 63 | Temp 98.2°F | Ht 62.0 in | Wt 179.0 lb

## 2022-03-21 DIAGNOSIS — E559 Vitamin D deficiency, unspecified: Secondary | ICD-10-CM

## 2022-03-21 DIAGNOSIS — Z6832 Body mass index (BMI) 32.0-32.9, adult: Secondary | ICD-10-CM

## 2022-03-21 DIAGNOSIS — E669 Obesity, unspecified: Secondary | ICD-10-CM

## 2022-03-21 DIAGNOSIS — E7849 Other hyperlipidemia: Secondary | ICD-10-CM | POA: Diagnosis not present

## 2022-03-21 DIAGNOSIS — Z9189 Other specified personal risk factors, not elsewhere classified: Secondary | ICD-10-CM

## 2022-03-21 DIAGNOSIS — F5089 Other specified eating disorder: Secondary | ICD-10-CM

## 2022-03-21 DIAGNOSIS — E8881 Metabolic syndrome: Secondary | ICD-10-CM

## 2022-03-21 DIAGNOSIS — E785 Hyperlipidemia, unspecified: Secondary | ICD-10-CM

## 2022-03-21 MED ORDER — METFORMIN HCL 500 MG PO TABS: 500.0000 mg | ORAL_TABLET | Freq: Two times a day (BID) | ORAL | 0 refills | Status: DC

## 2022-03-24 ENCOUNTER — Other Ambulatory Visit: Payer: Self-pay | Admitting: Neurology

## 2022-03-24 NOTE — Progress Notes (Unsigned)
Chief Complaint:   OBESITY Krista Henson is here to discuss her progress with her obesity treatment plan along with follow-up of her obesity related diagnoses. Krista Henson is on keeping a food journal and adhering to recommended goals of 1200-1300 calories and 100+ grams of  protein daily and Category 2 Plan and states she is following her eating plan approximately 70% of the time. [name] states she is doing yoga for 60 minutes twice a week and walking for 15 minutes 5 times per week.  Today's visit was #: 9 Starting weight: 177 lbs Starting date: 09/21/2021 Today's weight: 179 lbs Today's date: 03/21/2022 Total lbs lost to date: 0 lbs Total lbs lost since last in-office visit: 0 lbs  Interim History: Krista Henson is struggling with mental health issues and she is feeling super overwhelmed. This is causing her to not lose weight and it is very distressing to her.  Subjective:   1. Insulin resistance, with carbohydrate cravings Krista Henson is currently taking Metformin and feels full faster with less cravings. Her recent A1c  = 5.0 and fasting insulin levels = 12.6 today  2. Other hyperlipidemia Dicussed recent labs today: LDL = 112 (was 117 6 months ago). She has a familial history of Hyperlipidemia. Krista Henson in not currently taking any medication. Krista Henson has hyperlipidemia and has been trying to improve her cholesterol levels with intensive lifestyle modifications including a low saturated fat diet, exercise, and weight loss. She denies any chest pain, claudication, or myalgias.  Lab Results  Component Value Date   ALT 24 09/21/2021   AST 16 09/21/2021   ALKPHOS 62 09/21/2021   BILITOT 0.3 09/21/2021   Lab Results  Component Value Date   CHOL 191 02/26/2022   HDL 66 02/26/2022   LDLCALC 112 (H) 02/26/2022   TRIG 71 02/26/2022   CHOLHDL 2.9 02/26/2022    3. Vitamin D deficiency She is currently taking  a multivitamin with Vitamin D OTC .   Lab Results  Component Value Date   VD25OH 49.5  02/26/2022   VD25OH 40.3 09/21/2021   4. Other disorder of eating, with emotional eating Krista Henson is working with Dr. Cruz Condon and with her own counselor with at [facility]..   5. At risk for depression Krista Henson is at risk for depression due to stressors, generalized anxiety disorder, and ADHD.  Assessment/Plan:  No orders of the defined types were placed in this encounter.   Medications Discontinued During This Encounter  Medication Reason   metFORMIN (GLUCOPHAGE) 500 MG tablet Reorder     Meds ordered this encounter  Medications   metFORMIN (GLUCOPHAGE) 500 MG tablet    Sig: Take 1 tablet (500 mg total) by mouth 2 (two) times daily with a meal. (Lunch and dinner)    Dispense:  120 tablet    Refill:  0    30 d supply;  ** OV for RF **   Do not send RF request     1. Insulin resistance, with carbohydrate cravings Insulin level is slightly up/worse, A1c essentially unchanged. [name] will work on decreasing simple cars and continue with her Personal Nutrition Plan. Will refill Metformin as follows: - metFORMIN (GLUCOPHAGE) 500 MG tablet; Take 1 tablet (500 mg total) by mouth 2 (two) times daily with a meal. (Lunch and dinner)  Dispense: 120 tablet; Refill: 0  2. Other hyperlipidemia Cardiovascular risk and specific lipid/LDL goals reviewed.  We discussed several lifestyle modifications today and Krista Henson will continue to work on diet, exercise and weight loss efforts.  Orders and follow up as documented in patient record.   Counseling Intensive lifestyle modifications are the first line treatment for this issue. Dietary changes: Increase soluble fiber. Decrease simple carbohydrates. Exercise changes: Moderate to vigorous-intensity aerobic activity 150 minutes per week if tolerated. Lipid-lowering medications: see documented in medical record.  3. Vitamin D deficiency Krista Henson's level is at goal - nearly 50 today. She will continue with her Personal Nutrition Plan and weight  loss. Low Vitamin D level contributes to fatigue and are associated with obesity, breast, and colon cancer. She agrees to continue to take prescription Vitamin D @50 ,000 IU every week and will follow-up for routine testing of Vitamin D, at least 2-3 times per year to avoid over-replacement.  4. Other disorder of eating, with emotional eating Behavior modification techniques were discussed today to help Krista Henson deal with her emotional/non-hunger eating behaviors.  Orders and follow up as documented in patient record.  Ellene will continue taking Wellbutrin and Effexor.  5. At risk for depression Krista Henson was given approximately 15 minutes of depression risk counseling today. She has risk factors for depression including GAD, ADHD, and current stressor.. We discussed the importance of a healthy work life balance, a healthy relationship with food and a good support system.  Repetitive spaced learning was employed today to elicit superior memory formation and behavioral change.   6. Obesity, Current BMI 32.8 Krista Henson is currently in the action stage of change. As such, her goal is to continue with weight loss efforts. She has agreed to practicing portion control and making smarter food choices, such as increasing vegetables and decreasing simple carbohydrates.   Exercise goals: As is.  Behavioral modification strategies: keeping healthy foods in the home, better snacking choices, and emotional eating strategies.  Krista Henson has agreed to follow-up with our clinic in 3 weeks. She was informed of the importance of frequent follow-up visits to maximize her success with intensive lifestyle modifications for her multiple health conditions.   Objective:   Blood pressure 118/75, pulse 63, temperature 98.2 F (36.8 C), height 5\' 2"  (1.575 m), weight 179 lb (81.2 kg), SpO2 99 %. Body mass index is 32.74 kg/m.  General: Cooperative, alert, well developed, in no acute distress. HEENT: Conjunctivae and lids  unremarkable. Cardiovascular: Regular rhythm.  Lungs: Normal work of breathing. Neurologic: No focal deficits.   Lab Results  Component Value Date   CREATININE 0.75 09/21/2021   BUN 16 09/21/2021   NA 142 09/21/2021   K 4.8 09/21/2021   CL 101 09/21/2021   CO2 25 09/21/2021   Lab Results  Component Value Date   ALT 24 09/21/2021   AST 16 09/21/2021   ALKPHOS 62 09/21/2021   BILITOT 0.3 09/21/2021   Lab Results  Component Value Date   HGBA1C 5.0 02/26/2022   HGBA1C 4.8 09/21/2021   Lab Results  Component Value Date   INSULIN 12.6 02/26/2022   INSULIN 11.2 09/21/2021   Lab Results  Component Value Date   TSH 1.350 09/21/2021   Lab Results  Component Value Date   CHOL 191 02/26/2022   HDL 66 02/26/2022   LDLCALC 112 (H) 02/26/2022   TRIG 71 02/26/2022   CHOLHDL 2.9 02/26/2022   Lab Results  Component Value Date   VD25OH 49.5 02/26/2022   VD25OH 40.3 09/21/2021   No results found for: "WBC", "HGB", "HCT", "MCV", "PLT" No results found for: "IRON", "TIBC", "FERRITIN"  Attestation Statements:   Reviewed by clinician on day of visit: allergies, medications, problem list, medical  history, surgical history, family history, social history, and previous encounter notes.  ILennette Bihari, CMA, am acting as transcriptionist for Dr. Raliegh Scarlet, DO.    I have reviewed the above documentation for accuracy and completeness, and I agree with the above. Marjory Sneddon, D.O.  The [place] was signed into law in 2016 which includes the topic of electronic health records.  This provides immediate access to information in MyChart.  This includes consultation notes, operative notes, office notes, lab results and pathology reports.  If you have any questions about what you read please let us know at your next visit so we can discuss your concerns and take corrective action if need be.  We are right here with you.

## 2022-03-26 ENCOUNTER — Other Ambulatory Visit (INDEPENDENT_AMBULATORY_CARE_PROVIDER_SITE_OTHER): Payer: Self-pay | Admitting: Family Medicine

## 2022-03-26 DIAGNOSIS — E8881 Metabolic syndrome: Secondary | ICD-10-CM

## 2022-03-29 ENCOUNTER — Ambulatory Visit: Payer: 59 | Admitting: Adult Health

## 2022-04-03 ENCOUNTER — Telehealth (INDEPENDENT_AMBULATORY_CARE_PROVIDER_SITE_OTHER): Payer: 59 | Admitting: Psychology

## 2022-04-03 DIAGNOSIS — F909 Attention-deficit hyperactivity disorder, unspecified type: Secondary | ICD-10-CM

## 2022-04-03 DIAGNOSIS — F32A Depression, unspecified: Secondary | ICD-10-CM | POA: Diagnosis not present

## 2022-04-03 DIAGNOSIS — F419 Anxiety disorder, unspecified: Secondary | ICD-10-CM | POA: Diagnosis not present

## 2022-04-03 DIAGNOSIS — F5089 Other specified eating disorder: Secondary | ICD-10-CM | POA: Diagnosis not present

## 2022-04-03 NOTE — Progress Notes (Signed)
Office: [phone]  /  Fax: [phone]    Date: April 03, 2022    Appointment Start Time: 8:34am Duration: 28 minutes Provider: Glennie Isle, Psy.D. Type of Session: Individual Therapy  Location of Patient: Home (private location) Location of Provider: Provider's Home (private office) Type of Contact: Telepsychological Visit via MyChart Video Visit  Session Content: Dorena is a 31 y.o. female presenting for a follow-up appointment to address the previously established treatment goal of increasing coping skills.Today's appointment was a telepsychological visit. [name] provided verbal consent for today's telepsychological appointment and she is aware she is responsible for securing confidentiality on her end of the session. Prior to proceeding with today's appointment, Gennett's physical location at the time of this appointment was obtained as well a phone number she could be reached at in the event of technical difficulties. Trannie and this provider participated in today's telepsychological service. Of note, today's appointment was switched to a regular telephone call at 8:35am with Sidra's verbal consent due to technical issues.   This provider conducted a brief check-in. Abiola shared about her recent appointment with Dr. Raliegh Scarlet. She was reportedly provided goals to work toward versus following a structured meal plan given ongoing stressors. Lovell described feeling "pretty okay" with this change. She also discussed ongoing challenges with ADHD-related concerns and described recent examples. Remainder of today's appointment focused on assisting Worthington meet her newly established goals for food intake. She was agreeable to asking herself questions to increase awareness and help her make better choices (e.g., Am I really hungry?, Is there something bothering me?, and Will I feel better if I eat?). She also identified activities she can engage in when she determines she is experiencing emotional  hunger (e.g., working on [employer], stepping outside and focusing on breathing). Shunte was receptive to today's appointment as evidenced by openness to sharing, responsiveness to feedback, and willingness to implement discussed strategies .  Mental Status Examination:  Appearance: neat Behavior: appropriate to circumstances Mood: neutral Affect: mood congruent Speech: WNL Eye Contact: appropriate Psychomotor Activity: WNL Gait: unable to assess Thought Process: linear, logical, and goal directed and no evidence or endorsement of suicidal, homicidal, and self-harm ideation, plan and intent  Thought Content/Perception: no hallucinations, delusions, bizarre thinking or behavior endorsed or observed Orientation: AAOx4 Memory/Concentration: memory, attention, language, and fund of knowledge intact  Insight: fair Judgment: fair  Interventions:  Conducted a brief chart review Provided empathic reflections and validation Reviewed content from the previous session Employed supportive psychotherapy interventions to facilitate reduced distress and to improve coping skills with identified stressors Engaged patient in problem solving  DSM-5 Diagnosis(es): F50.89 Other Specified Feeding or Eating Disorder, Emotional and Binge Eating Behaviors, F41.9 Unspecified Anxiety Disorder, F32.A Unspecified Depressive Disorder, and F90.9 Unspecified Attention-Deficit/Hyperactivity Disorder   Treatment Goal & Progress: During the initial appointment with this provider, the following treatment goal was established: increase coping skills. Amri has demonstrated progress in her goal as evidenced by increased awareness of hunger patterns, increased awareness of triggers for emotional eating behaviors, reduction in emotional eating behaviors , and reduction in binge eating behaviors. [place] also continues to demonstrate willingness to engage in learned skill(s).  Plan: Based on recent stressors and challenges, the next  appointment is scheduled for 05/21/2022 at 2:30pm, which will be via MyChart Video Visit. The next session will focus on working towards the established treatment goal. Rickayla will continue meeting with her primary therapist every Friday, and she is scheduled for an ADHD evaluation on July 23, 2021 with  [facility].

## 2022-04-04 ENCOUNTER — Encounter: Payer: Self-pay | Admitting: Neurology

## 2022-04-04 NOTE — Telephone Encounter (Signed)
We could try qulipta?

## 2022-04-09 MED ORDER — QULIPTA 60 MG PO TABS: 60.0000 mg | ORAL_TABLET | Freq: Every day | ORAL | 3 refills | Status: DC

## 2022-04-09 NOTE — Telephone Encounter (Signed)
Qulipta 60 mg PO DAILY ordered per v.o. Megan NP.

## 2022-04-09 NOTE — Telephone Encounter (Signed)
yes

## 2022-05-17 ENCOUNTER — Ambulatory Visit (INDEPENDENT_AMBULATORY_CARE_PROVIDER_SITE_OTHER): Payer: 59 | Admitting: Family Medicine

## 2022-05-17 ENCOUNTER — Encounter (INDEPENDENT_AMBULATORY_CARE_PROVIDER_SITE_OTHER): Payer: Self-pay | Admitting: Family Medicine

## 2022-05-17 VITALS — BP 138/88 | HR 97 | Temp 97.9°F | Ht 62.0 in | Wt 176.8 lb

## 2022-05-17 DIAGNOSIS — E88819 Insulin resistance, unspecified: Secondary | ICD-10-CM | POA: Diagnosis not present

## 2022-05-17 DIAGNOSIS — F5089 Other specified eating disorder: Secondary | ICD-10-CM | POA: Diagnosis not present

## 2022-05-17 DIAGNOSIS — F509 Eating disorder, unspecified: Secondary | ICD-10-CM | POA: Insufficient documentation

## 2022-05-17 DIAGNOSIS — F39 Unspecified mood [affective] disorder: Secondary | ICD-10-CM

## 2022-05-17 DIAGNOSIS — E669 Obesity, unspecified: Secondary | ICD-10-CM | POA: Diagnosis not present

## 2022-05-17 DIAGNOSIS — Z6832 Body mass index (BMI) 32.0-32.9, adult: Secondary | ICD-10-CM

## 2022-05-17 MED ORDER — METFORMIN HCL 500 MG PO TABS: 500.0000 mg | ORAL_TABLET | Freq: Every day | ORAL | 0 refills | Status: DC

## 2022-05-21 ENCOUNTER — Telehealth (INDEPENDENT_AMBULATORY_CARE_PROVIDER_SITE_OTHER): Payer: 59 | Admitting: Psychology

## 2022-05-21 DIAGNOSIS — F419 Anxiety disorder, unspecified: Secondary | ICD-10-CM | POA: Diagnosis not present

## 2022-05-21 DIAGNOSIS — F32A Depression, unspecified: Secondary | ICD-10-CM

## 2022-05-21 DIAGNOSIS — F5089 Other specified eating disorder: Secondary | ICD-10-CM | POA: Diagnosis not present

## 2022-05-21 DIAGNOSIS — F909 Attention-deficit hyperactivity disorder, unspecified type: Secondary | ICD-10-CM | POA: Diagnosis not present

## 2022-05-21 NOTE — Progress Notes (Signed)
  Office: [phone]  /  Fax: [phone]    Date: May 21, 2022    Appointment Start Time: 2:31pm Duration: 34 minutes Provider: Lawerance Cruel, Psy.D. Type of Session: Individual Therapy  Location of Patient: Home (private location) Location of Provider: Provider's Home (private office) Type of Contact: Telepsychological Visit via MyChart Video Visit  Session Content: Krista Henson is a 31 y.o. female presenting for a follow-up appointment to address the previously established treatment goal of increasing coping skills.Today's appointment was a telepsychological visit. Krista Henson provided verbal consent for today's telepsychological appointment and she is aware she is responsible for securing confidentiality on her end of the session. Prior to proceeding with today's appointment, Krista Henson's physical location at the time of this appointment was obtained as well a phone number she could be reached at in the event of technical difficulties. Krista Henson and this provider participated in today's telepsychological service.   This provider conducted a brief check-in. Krista Henson shared about recent events, including ongoing stressors (e.g., work, school). She indicated she experienced a "depressive episode," noting an improvement. Krista Henson further stated she was diagnosed with ADHD and started Vyvanse, adding, "I'm so much happier." Regarding eating habits, she discussed focusing on making better choices and engaging in portion control resulting in weight loss. Despite ongoing stressors, she noted an overall reduction in binge and emotional eating behaviors. Given her recent prescription of Vyvanse, she discussed challenges eating regularly. It was recommended she set reminders on her phone and utilize visuals. She agreed. Overall, Krista Henson was receptive to today's appointment as evidenced by openness to sharing, responsiveness to feedback, and willingness to continue engaging in learned skills.  Mental Status Examination:   Appearance: neat Behavior: appropriate to circumstances Mood: neutral Affect: mood congruent Speech: WNL Eye Contact: appropriate Psychomotor Activity: WNL Gait: unable to assess Thought Process: linear, logical, and goal directed and denied experiencing suicidal, homicidal, and self-harm ideation, plan and intent  Thought Content/Perception: no hallucinations, delusions, bizarre thinking or behavior endorsed or observed Orientation: AAOx4 Memory/Concentration: memory, attention, language, and fund of knowledge intact  Insight: good Judgment: good  Interventions:  Conducted a brief chart review Provided empathic reflections and validation Provided positive reinforcement Employed supportive psychotherapy interventions to facilitate reduced distress and to improve coping skills with identified stressors Engaged patient in problem solving  DSM-5 Diagnosis(es):  F50.89 Other Specified Feeding or Eating Disorder, Emotional and Binge Eating Behaviors, F41.9 Unspecified Anxiety Disorder, F32.A Unspecified Depressive Disorder, and F90.9 Unspecified Attention-Deficit/Hyperactivity Disorder   Treatment Goal & Progress:  During the initial appointment with this provider, the following treatment goal was established: increase coping skills. Krista Henson has demonstrated progress in her goal as evidenced by increased awareness of hunger patterns, increased awareness of triggers for emotional eating behaviors, reduction in emotional eating behaviors , and reduction in binge eating behaviors. Krista Henson also continues to demonstrate willingness to engage in learned skill(s).   Plan: Krista Henson requested an additional appointment with this provider given recent changes/stressors. The next appointment is scheduled for 07/16/2022 at 9am, which will be via MyChart Video Visit. The next session will focus on working towards the established treatment goal and termination. She will continue with her primary therapist and  psychiatric provider, but expressed desire to receive additional referrals for therapeutic services.

## 2022-05-30 NOTE — Progress Notes (Signed)
Chief Complaint:   OBESITY Krista Henson is here to discuss her progress with her obesity treatment plan along with follow-up of her obesity related diagnoses. Krista Henson is on practicing portion control and making smarter food choices, such as increasing vegetables and decreasing simple carbohydrates and states she is following her eating plan approximately 60% of the time. Krista Henson states she is doing yoga 30 minutes 5 times per week.  Today's visit was #: 10 Starting weight: 177 lbs Starting date: 09/21/2021 Today's weight: 176 lbs Today's date: 05/17/2022 Total lbs lost to date: 1 lb Total lbs lost since last in-office visit: 3 lbs  Interim History: She is in school, 3 classes and working full time.  "I am getting my ass kicked".  She is getting a treadmill for black Friday and looking forward to increasing her exercise.   Subjective:   1. Insulin resistance, with carbohydrate cravings She is unable to tolerated BID.  Take in AM with breakfast.   2. Mood disorder (HCC) [place] still doing her mental health medications.  Now they have started Vyvanse for her and she will start it tomorrow.   3. Other disorder of eating, with emotional eating She is still working with Dr Dewaine Conger.  She is doing well and meets with her tomorrow.   Assessment/Plan:  No orders of the defined types were placed in this encounter.   Medications Discontinued During This Encounter  Medication Reason   metFORMIN (GLUCOPHAGE) 500 MG tablet Reorder     Meds ordered this encounter  Medications   metFORMIN (GLUCOPHAGE) 500 MG tablet    Sig: Take 1 tablet (500 mg total) by mouth daily with breakfast. (Lunch and dinner)    Dispense:  60 tablet    Refill:  0    30 d supply;  ** OV for RF **   Do not send RF request     1. Insulin resistance, with carbohydrate cravings Continue PNP and refill medication.  Refill - metFORMIN (GLUCOPHAGE) 500 MG tablet; Take 1 tablet (500 mg total) by mouth daily with breakfast.  (Lunch and dinner)  Dispense: 60 tablet; Refill: 0  2. Mood disorder (HCC) Medication management per psych.  Continue to exercise.   3. Other disorder of eating, with emotional eating Mindful eating handout given to patient and strategies discussed.   4. Obesity, Current BMI 32.3 Krista Henson is currently in the action stage of change. As such, her goal is to continue with weight loss efforts. She has agreed to practicing portion control and making smarter food choices, such as increasing vegetables and decreasing simple carbohydrates.   Exercise goals:  As is, but increase as tolerated.   Behavioral modification strategies: increasing lean protein intake and decreasing simple carbohydrates.  Krista Henson has agreed to follow-up with our clinic in 8 weeks. She was informed of the importance of frequent follow-up visits to maximize her success with intensive lifestyle modifications for her multiple health conditions.   Objective:   Blood pressure 138/88, pulse 97, temperature 97.9 F (36.6 C), height 5\' 2"  (1.575 m), weight 176 lb 12.8 oz (80.2 kg), SpO2 98 %. Body mass index is 32.34 kg/m.  General: Cooperative, alert, well developed, in no acute distress. HEENT: Conjunctivae and lids unremarkable. Cardiovascular: Regular rhythm.  Lungs: Normal work of breathing. Neurologic: No focal deficits.   Lab Results  Component Value Date   CREATININE 0.75 09/21/2021   BUN 16 09/21/2021   NA 142 09/21/2021   K 4.8 09/21/2021   CL 101 09/21/2021  CO2 25 09/21/2021   Lab Results  Component Value Date   ALT 24 09/21/2021   AST 16 09/21/2021   ALKPHOS 62 09/21/2021   BILITOT 0.3 09/21/2021   Lab Results  Component Value Date   HGBA1C 5.0 02/26/2022   HGBA1C 4.8 09/21/2021   Lab Results  Component Value Date   INSULIN 12.6 02/26/2022   INSULIN 11.2 09/21/2021   Lab Results  Component Value Date   TSH 1.350 09/21/2021   Lab Results  Component Value Date   CHOL 191 02/26/2022   HDL  66 02/26/2022   LDLCALC 112 (H) 02/26/2022   TRIG 71 02/26/2022   CHOLHDL 2.9 02/26/2022   Lab Results  Component Value Date   VD25OH 49.5 02/26/2022   VD25OH 40.3 09/21/2021   No results found for: "WBC", "HGB", "HCT", "MCV", "PLT" No results found for: "IRON", "TIBC", "FERRITIN"  Attestation Statements:   Reviewed by clinician on day of visit: allergies, medications, problem list, medical history, surgical history, family history, social history, and previous encounter notes.  I, Davy Pique, RMA, am acting as Location manager for [employer], DO.   I have reviewed the above documentation for accuracy and completeness, and I agree with the above. Marjory Sneddon, D.O.  The [place] was signed into law in 2016 which includes the topic of electronic health records.  This provides immediate access to information in MyChart.  This includes consultation notes, operative notes, office notes, lab results and pathology reports.  If you have any questions about what you read please let us know at your next visit so we can discuss your concerns and take corrective action if need be.  We are right here with you.

## 2022-05-31 ENCOUNTER — Other Ambulatory Visit (INDEPENDENT_AMBULATORY_CARE_PROVIDER_SITE_OTHER): Payer: Self-pay | Admitting: Family Medicine

## 2022-05-31 DIAGNOSIS — E88819 Insulin resistance, unspecified: Secondary | ICD-10-CM

## 2022-06-14 ENCOUNTER — Encounter: Payer: Self-pay | Admitting: Adult Health

## 2022-06-14 ENCOUNTER — Ambulatory Visit: Payer: 59 | Admitting: Adult Health

## 2022-06-14 ENCOUNTER — Other Ambulatory Visit: Payer: Self-pay | Admitting: Adult Health

## 2022-06-14 DIAGNOSIS — G43709 Chronic migraine without aura, not intractable, without status migrainosus: Secondary | ICD-10-CM

## 2022-06-14 MED ORDER — DICLOFENAC POTASSIUM 25 MG PO CAPS: 1.0000 | ORAL_CAPSULE | ORAL | 1 refills | Status: DC | PRN

## 2022-06-14 MED ORDER — ONABOTULINUMTOXINA 200 UNITS IJ SOLR
155.0000 [IU] | Freq: Once | INTRAMUSCULAR | Status: AC
  Administered 2022-06-14: 155 [IU] via INTRAMUSCULAR

## 2022-06-14 NOTE — Progress Notes (Signed)
Botox- 200 units x 1 vial Lot: [id number] Expiration: 10/2024 NDC: 0023-3921-02  Bacteriostatic 0.9% Sodium Chloride- 4mL total Lot: [id number] Expiration: 03/03/2023 NDC: 0409-1966-02  Dx: G43.709 B/B  

## 2022-06-14 NOTE — Progress Notes (Signed)
06/14/22: headaches are better. When botox wears off then headaches get worse. Loletha Grayer- feels that it might have helped. Does feel that it helped more than atentolol    03/15/22: more headaches this last month. Fortunately they have been mild.  Wondering if she should increase atenolol.  She is already had a good dose.  May consider adding on Qulipta in the future  09/06/21: Reports that migraines have decreased since she started Botox therapy.    BOTOX PROCEDURE NOTE FOR MIGRAINE HEADACHE    Contraindications and precautions discussed with patient(above). Aseptic procedure was observed and patient tolerated procedure. Procedure performed by Butch Penny, NP  The condition has existed for more than 6 months, and pt does not have a diagnosis of ALS, Myasthenia Gravis or Lambert-Eaton Syndrome.  Risks and benefits of injections discussed and pt agrees to proceed with the procedure.  Written consent obtained  These injections are medically necessary.  These injections do not cause sedations or hallucinations which the oral therapies may cause.  Indication/Diagnosis: chronic migraine BOTOX([id number]) injection was performed according to protocol by [employer]. 200 units of BOTOX was dissolved into 4 cc NS.   NDC: [address]-22 Botox- 200 units x 1 vial Lot: [id number] Expiration: 10/2024 NDC: 1941-7408-14   Bacteriostatic 0.9% Sodium Chloride- 10mL total Lot: [id number] Expiration: 03/03/2023 NDC: 3149-7026-37   Dx: C58.850    Description of procedure:  The patient was placed in a sitting position. The standard protocol was used for Botox as follows, with 5 units of Botox injected at each site:   -Procerus muscle, midline injection  -Corrugator muscle, bilateral injection  -Frontalis muscle, bilateral injection, with 2 sites each side, medial injection was performed in the upper one third of the frontalis muscle, in the region vertical from the medial inferior edge of the superior  orbital rim. The lateral injection was again in the upper one third of the forehead vertically above the lateral limbus of the cornea, 1.5 cm lateral to the medial injection site. 1 unit placed between slighty below two injections.   -Temporalis muscle injection, 4 sites, bilaterally. The first injection was 3 cm above the tragus of the ear, second injection site was 1.5 cm to 3 cm up from the first injection site in line with the tragus of the ear. The third injection site was 1.5-3 cm forward between the first 2 injection sites. The fourth injection site was 1.5 cm posterior to the second injection site.  -Occipitalis muscle injection, 3 sites, bilaterally. The first injection was done one half way between the occipital protuberance and the tip of the mastoid process behind the ear. The second injection site was done lateral and superior to the first, 1 fingerbreadth from the first injection. The third injection site was 1 fingerbreadth superiorly and medially from the first injection site.  -Cervical paraspinal muscle injection, 2 sites, bilateral  first injection site was 1 cm from the midline of the cervical spine, 3 cm inferior to the lower border of the occipital protuberance. The second injection site was 1.5 cm superiorly and laterally to the first injection site.   -Trapezius muscle deferred- patient refused   Will return for repeat injection in 3 months.   A 200 units of Botox was used, 139 units were injected, the rest of the Botox was wasted. The patient tolerated the procedure well, there were no complications of the above procedure.  Butch Penny, MSN, NP-C 06/14/2022, 8:17 AM [facility] [address] [place], [place]  [address] [phone] 

## 2022-07-09 ENCOUNTER — Telehealth (INDEPENDENT_AMBULATORY_CARE_PROVIDER_SITE_OTHER): Payer: 59 | Admitting: Psychology

## 2022-07-09 DIAGNOSIS — F909 Attention-deficit hyperactivity disorder, unspecified type: Secondary | ICD-10-CM

## 2022-07-09 DIAGNOSIS — F419 Anxiety disorder, unspecified: Secondary | ICD-10-CM

## 2022-07-09 DIAGNOSIS — F32A Depression, unspecified: Secondary | ICD-10-CM

## 2022-07-09 DIAGNOSIS — F5089 Other specified eating disorder: Secondary | ICD-10-CM | POA: Diagnosis not present

## 2022-07-09 NOTE — Progress Notes (Signed)
  Office: [phone]  /  Fax: [phone]    Date: July 09, 2022    Appointment Start Time: 10:03am Duration: 29 minutes Provider: Glennie Isle, Psy.D. Type of Session: Individual Therapy  Location of Patient: Home (private location) Location of Provider: Provider's Home (private office) Type of Contact: Telepsychological Visit via MyChart Video Visit  Session Content: Krista Henson is a 32 y.o. female presenting for a follow-up appointment to address the previously established treatment goal of increasing coping skills.Today's appointment was a telepsychological visit. Krista Henson provided verbal consent for today's telepsychological appointment and she is aware she is responsible for securing confidentiality on her end of the session. Prior to proceeding with today's appointment, Krista Henson's physical location at the time of this appointment was obtained as well a phone number she could be reached at in the event of technical difficulties. Krista Henson and this provider participated in today's telepsychological service.   This provider conducted a brief check-in. Krista Henson shared about events since the last appointment with this provider. She discussed some challenge with her eating habits due to finals, holidays, and her birthday. Notably, she shared she has maintained her weight and is not engaging in binge eating behaviors. To reduce overeating, she reported she eats slowly and is mindful when she goes out to eat; however, she discussed feeling "self-conscious." Further explored and processed. Reviewed previously discussed strategies. Moreover, she discussed since the holidays she notices having a craving for a sweet every evening. Krista Henson reported she sometimes will given into the craving. Further explored and processed. Reviewed triggers for emotional eating behaviors. She noted a plan to "purge" sweets. Reviewed the plan previously developed including relaxation, distractions, places, people, and things.  Discussed termination planning.Overall, Krista Henson was receptive to today's appointment as evidenced by openness to sharing, responsiveness to feedback, and willingness to implement discussed strategies .  Mental Status Examination:  Appearance: neat Behavior: appropriate to circumstances Mood: neutral Affect: mood congruent Speech: WNL Eye Contact: appropriate Psychomotor Activity: WNL Gait: unable to assess Thought Process: linear, logical, and goal directed and no evidence or endorsement of suicidal, homicidal, and self-harm ideation, plan and intent  Thought Content/Perception: no hallucinations, delusions, bizarre thinking or behavior endorsed or observed Orientation: AAOx4 Memory/Concentration: memory, attention, language, and fund of knowledge intact  Insight: fair Judgment: fair  Interventions:  Conducted a brief chart review Provided empathic reflections and validation Reviewed content from the previous session Provided positive reinforcement Employed supportive psychotherapy interventions to facilitate reduced distress and to improve coping skills with identified stressors Engaged patient in problem solving Discussed termination planning  DSM-5 Diagnosis(es):  F50.89 Other Specified Feeding or Eating Disorder, Emotional and Binge Eating Behaviors, F41.9 Unspecified Anxiety Disorder, F32.A Unspecified Depressive Disorder, and F90.9 Unspecified Attention-Deficit/Hyperactivity Disorder   Treatment Goal & Progress: During the initial appointment with this provider, the following treatment goal was established: increase coping skills. Krista Henson has demonstrated progress in her goal as evidenced by increased awareness of hunger patterns, increased awareness of triggers for emotional eating behaviors, reduction in emotional eating behaviors , and reduction in binge eating behaviors. Krista Henson also continues to demonstrate willingness to engage in learned skill(s).  Plan: The next appointment  is scheduled for 07/30/2022 at 10am, which will be via MyChart Video Visit. The next session will focus on working towards the established treatment goal and possible termination. Krista Henson will continue with her primary therapist.

## 2022-07-12 ENCOUNTER — Encounter (INDEPENDENT_AMBULATORY_CARE_PROVIDER_SITE_OTHER): Payer: Self-pay | Admitting: Family Medicine

## 2022-07-12 ENCOUNTER — Ambulatory Visit (INDEPENDENT_AMBULATORY_CARE_PROVIDER_SITE_OTHER): Payer: 59 | Admitting: Family Medicine

## 2022-07-12 VITALS — BP 133/95 | HR 89 | Temp 98.5°F | Ht 62.0 in | Wt 170.6 lb

## 2022-07-12 DIAGNOSIS — Z6832 Body mass index (BMI) 32.0-32.9, adult: Secondary | ICD-10-CM | POA: Diagnosis not present

## 2022-07-12 DIAGNOSIS — F988 Other specified behavioral and emotional disorders with onset usually occurring in childhood and adolescence: Secondary | ICD-10-CM | POA: Insufficient documentation

## 2022-07-12 DIAGNOSIS — E88819 Insulin resistance, unspecified: Secondary | ICD-10-CM

## 2022-07-12 DIAGNOSIS — E669 Obesity, unspecified: Secondary | ICD-10-CM

## 2022-07-12 DIAGNOSIS — F908 Attention-deficit hyperactivity disorder, other type: Secondary | ICD-10-CM

## 2022-07-12 MED ORDER — METFORMIN HCL 500 MG PO TABS: 500.0000 mg | ORAL_TABLET | Freq: Every day | ORAL | 0 refills | Status: DC

## 2022-07-23 ENCOUNTER — Ambulatory Visit (INDEPENDENT_AMBULATORY_CARE_PROVIDER_SITE_OTHER): Payer: 59 | Admitting: Psychology

## 2022-07-23 ENCOUNTER — Encounter: Payer: Self-pay | Admitting: Psychology

## 2022-07-23 DIAGNOSIS — F429 Obsessive-compulsive disorder, unspecified: Secondary | ICD-10-CM | POA: Diagnosis not present

## 2022-07-23 DIAGNOSIS — F909 Attention-deficit hyperactivity disorder, unspecified type: Secondary | ICD-10-CM

## 2022-07-23 DIAGNOSIS — F4010 Social phobia, unspecified: Secondary | ICD-10-CM | POA: Diagnosis not present

## 2022-07-23 NOTE — Progress Notes (Signed)
[place] Counselor Initial Adult Exam  Name: Krista Henson Date: 07/23/2022 MRN: [id number] DOB: 1990/08/09 PCP: Clerance Lav, PA-C  Time spent: 3:00 - 3:15 pm  Guardian/Informant:  Tacey Heap - Patient    Paperwork requested: No  Met with patient for consult.  Patient was at home and session was conducted from therapist's office via video conferencing.  Patient verbally consented to telehealth.    Reason for Visit /Presenting Problem: Patient reported initially being referred for testing regarding attention deficits by Glennie Isle, Ph.D. regarding attentions.  Patient stated that since that time she took online screening measures, which indicated ADHD in addition to her previous diagnoses of OCD and Social anxiety disorder.  Patient stated that since she has been diagnosed with ADHD by her PCP and is responding well to the medication that was prescribed (Vyvanse) she did not believe that testing was necessary at this time and requested to cancel the assessment.     Mental Status Exam: Appearance:   Neat and Well Groomed     Behavior:  Appropriate  Motor:  Normal  Speech/Language:   Clear and Coherent and Normal Rate  Affect:  Full Range  Mood:  euthymic  Thought process:  normal  Thought content:    WNL  Sensory/Perceptual disturbances:    WNL  Orientation:  oriented to person, place, time/date, and situation  Attention:  Good  Concentration:  Good  Memory:  WNL  Fund of knowledge:   Good  Insight:    Good  Judgment:   Good  Impulse Control:  Good    Diagnoses:  Attention deficit hyperactivity disorder (ADHD), unspecified ADHD type  Obsessive-compulsive disorder, unspecified type  Social anxiety disorder  Plan of Care: Patient declined to complete the intake interview and requested to cancel testing, as she has already been diagnosed with ADHD and is responding well to  treatment from her PCP (Vyvanse). All remaining appointments will  be cancelled.   Rainey Pines, PhD

## 2022-07-23 NOTE — Progress Notes (Signed)
                Farida Mcreynolds, PhD 

## 2022-07-29 NOTE — Progress Notes (Unsigned)
Chief Complaint:   OBESITY Krista Henson is here to discuss her progress with her obesity treatment plan along with follow-up of her obesity related diagnoses. Krista Henson is on practicing portion control and making smarter food choices, such as increasing vegetables and decreasing simple carbohydrates and states she is following her eating plan approximately 0% of the time. Krista Henson states she is walking and Yoga 30 minutes 4-5 times per week.  Today's visit was #: 11 Starting weight: 177 lbs Starting date: 09/21/2021 Today's weight: 170 lbs Today's date: 07/12/2022 Total lbs lost to date: 7 Total lbs lost since last in-office visit: 6  Interim History: Krista Henson reports that she got a treadmill and is using it regularly for 30 minutes 5 days a week. She is on more focused meal plan now, either [place] or journaling. Pt is more organized with meal prep/planning.   Subjective:   1. Attention deficit hyperactivity disorder (ADHD), other type Jakai went on Vyvanse in November and doesn't mindlessly eat and has decreased cravings. Less ETOH!  2. Insulin resistance, with carbohydrate cravings Krista Henson is tolerating Metformin well without side effects. Pt wants snack ideas.  Assessment/Plan:  No orders of the defined types were placed in this encounter.   Medications Discontinued During This Encounter  Medication Reason   atenolol (TENORMIN) 100 MG tablet Completed Course   metFORMIN (GLUCOPHAGE) 500 MG tablet Reorder     Meds ordered this encounter  Medications   metFORMIN (GLUCOPHAGE) 500 MG tablet    Sig: Take 1 tablet (500 mg total) by mouth daily with breakfast. (Lunch and dinner)    Dispense:  60 tablet    Refill:  0    30 d supply;  ** OV for RF **   Do not send RF request     1. Attention deficit hyperactivity disorder (ADHD), other type Continue Vyvanse per specialists. Increase exercise. Continue prudent nutritional plan, decrease simple carbs to help with attention deficit  disorder.  2. Insulin resistance, with carbohydrate cravings Nutritional changes discussed with pt. Snack ideas discussed. Meal prep ideas discussed.  Refill- metFORMIN (GLUCOPHAGE) 500 MG tablet; Take 1 tablet (500 mg total) by mouth daily with breakfast. (Lunch and dinner)  Dispense: 60 tablet; Refill: 0  3. Obesity, Current BMI 31.2 Krista Henson is currently in the action stage of change. As such, her goal is to continue with weight loss efforts. She has agreed to practicing portion control and making smarter food choices, such as increasing vegetables and decreasing simple carbohydrates.   PC/[place] handout provided again. Continue exercise as is. Extensive discussion with pt regarding meal ideas; how to make things she loves in healthy ways and also after dinner snack ideas.  Exercise goals: For substantial health benefits, adults should do at least 150 minutes (2 hours and 30 minutes) a week of moderate-intensity, or 75 minutes (1 hour and 15 minutes) a week of vigorous-intensity aerobic physical activity, or an equivalent combination of moderate- and vigorous-intensity aerobic activity. Aerobic activity should be performed in episodes of at least 10 minutes, and preferably, it should be spread throughout the week.  Behavioral modification strategies: better snacking choices and avoiding temptations.  [name] has agreed to follow-up with our clinic in 6-8 weeks. She was informed of the importance of frequent follow-up visits to maximize her success with intensive lifestyle modifications for her multiple health conditions.   Objective:   Blood pressure (!) 133/95, pulse 89, temperature 98.5 F (36.9 C), height 5\' 2"  (1.575 m), weight 170 lb 9.6 oz (  77.4 kg), SpO2 98 %. Body mass index is 31.2 kg/m.  General: Cooperative, alert, well developed, in no acute distress. HEENT: Conjunctivae and lids unremarkable. Cardiovascular: Regular rhythm.  Lungs: Normal work of breathing. Neurologic: No  focal deficits.   Lab Results  Component Value Date   CREATININE 0.75 09/21/2021   BUN 16 09/21/2021   NA 142 09/21/2021   K 4.8 09/21/2021   CL 101 09/21/2021   CO2 25 09/21/2021   Lab Results  Component Value Date   ALT 24 09/21/2021   AST 16 09/21/2021   ALKPHOS 62 09/21/2021   BILITOT 0.3 09/21/2021   Lab Results  Component Value Date   HGBA1C 5.0 02/26/2022   HGBA1C 4.8 09/21/2021   Lab Results  Component Value Date   INSULIN 12.6 02/26/2022   INSULIN 11.2 09/21/2021   Lab Results  Component Value Date   TSH 1.350 09/21/2021   Lab Results  Component Value Date   CHOL 191 02/26/2022   HDL 66 02/26/2022   LDLCALC 112 (H) 02/26/2022   TRIG 71 02/26/2022   CHOLHDL 2.9 02/26/2022   Lab Results  Component Value Date   VD25OH 49.5 02/26/2022   VD25OH 40.3 09/21/2021    Attestation Statements:   Reviewed by clinician on day of visit: allergies, medications, problem list, medical history, surgical history, family history, social history, and previous encounter notes.  I, Kathlene November, BS, CMA, am acting as transcriptionist for [employer], DO.   I have reviewed the above documentation for accuracy and completeness, and I agree with the above. Marjory Sneddon, D.O.  The [place] was signed into law in 2016 which includes the topic of electronic health records.  This provides immediate access to information in MyChart.  This includes consultation notes, operative notes, office notes, lab results and pathology reports.  If you have any questions about what you read please let us know at your next visit so we can discuss your concerns and take corrective action if need be.  We are right here with you.

## 2022-07-30 ENCOUNTER — Telehealth (INDEPENDENT_AMBULATORY_CARE_PROVIDER_SITE_OTHER): Payer: 59 | Admitting: Psychology

## 2022-07-30 DIAGNOSIS — F32A Depression, unspecified: Secondary | ICD-10-CM | POA: Diagnosis not present

## 2022-07-30 DIAGNOSIS — F909 Attention-deficit hyperactivity disorder, unspecified type: Secondary | ICD-10-CM

## 2022-07-30 DIAGNOSIS — F419 Anxiety disorder, unspecified: Secondary | ICD-10-CM

## 2022-07-30 DIAGNOSIS — F5089 Other specified eating disorder: Secondary | ICD-10-CM

## 2022-07-30 NOTE — Progress Notes (Signed)
  Office: [phone]  /  Fax: [phone]    Date: July 30, 2022    Appointment Start Time: 10:01am Duration: 29 minutes Provider: Glennie Isle, Psy.D. Type of Session: Individual Therapy  Location of Patient: Home (private location) Location of Provider: Provider's Home (private office) Type of Contact: Telepsychological Visit via MyChart Video Visit  Session Content: Krista Henson is a 32 y.o. female presenting for a follow-up appointment to address the previously established treatment goal of increasing coping skills.Today's appointment was a telepsychological visit. Krista Henson provided verbal consent for today's telepsychological appointment and she is aware she is responsible for securing confidentiality on her end of the session. Prior to proceeding with today's appointment, Krista Henson's physical location at the time of this appointment was obtained as well a phone number she could be reached at in the event of technical difficulties. Krista Henson and this provider participated in today's telepsychological service.   This provider conducted a brief check-in. Krista Henson reported she is "doing really great with the Vyvanse" and focusing on eating regularly. Reflected on what went well that assisted in recent weight loss (e.g., current medication regimen; reduction in alcohol intake; making better choices; engaging in portion control). She was encouraged to write down the aforementioned for future reference. Psychoeducation provided regarding healthy weight loss. She continues to report a reduction in emotional and binge eating behaviors. Overall, Krista Henson was receptive to today's appointment as evidenced by openness to sharing, responsiveness to feedback, and willingness to continue engaging in learned skills.   Mental Status Examination:  Appearance: neat Behavior: appropriate to circumstances Mood: neutral Affect: mood congruent Speech: WNL Eye Contact: appropriate Psychomotor Activity: WNL Gait: unable  to assess Thought Process: linear, logical, and goal directed and no evidence or endorsement of suicidal, homicidal, and self-harm ideation, plan and intent  Thought Content/Perception: no hallucinations, delusions, bizarre thinking or behavior endorsed or observed Orientation: AAOx4 Memory/Concentration: memory, attention, language, and fund of knowledge intact  Insight: good Judgment: good  Interventions:  Conducted a brief chart review Provided empathic reflections and validation Provided positive reinforcement Employed supportive psychotherapy interventions to facilitate reduced distress and to improve coping skills with identified stressors Reviewed learned skills  DSM-5 Diagnosis(es):  F50.89 Other Specified Feeding or Eating Disorder, Emotional and Binge Eating Behaviors, F41.9 Unspecified Anxiety Disorder, F32.A Unspecified Depressive Disorder, and F90.9 Unspecified Attention-Deficit/Hyperactivity Disorder   Treatment Goal & Progress: During the initial appointment with this provider, the following treatment goal was established: increase coping skills. Krista Henson demonstrated progress in her goal as evidenced by increased awareness of hunger patterns, increased awareness of triggers for emotional eating behaviors, reduction in emotional eating behaviors , and reduction in binge eating behaviors. Krista Henson also continues to demonstrate willingness to engage in learned skill(s).   Plan: As previously planned, today was Krista Henson's last appointment with this provider. Krista Henson will continue with her primary therapist and psychiatric provider. She acknowledged understanding that she may request a follow-up appointment with this provider in the future as long as she is still established with the clinic. No further follow-up planned by this provider.

## 2022-08-07 ENCOUNTER — Ambulatory Visit: Payer: 59 | Admitting: Psychology

## 2022-08-13 ENCOUNTER — Encounter (INDEPENDENT_AMBULATORY_CARE_PROVIDER_SITE_OTHER): Payer: Self-pay | Admitting: Family Medicine

## 2022-08-15 ENCOUNTER — Ambulatory Visit: Payer: 59 | Admitting: Psychology

## 2022-08-19 ENCOUNTER — Encounter: Payer: Self-pay | Admitting: Adult Health

## 2022-08-20 MED ORDER — QULIPTA 60 MG PO TABS: 60.0000 mg | ORAL_TABLET | Freq: Every day | ORAL | 5 refills | Status: DC

## 2022-08-28 LAB — BASIC METABOLIC PANEL
BUN: 10 (ref 4–21)
CO2: 26 — AB (ref 13–22)
Chloride: 103 (ref 99–108)
Creatinine: 0.8 (ref 0.5–1.1)
Glucose: 79
Potassium: 4 mEq/L (ref 3.5–5.1)
Sodium: 139 (ref 137–147)

## 2022-08-28 LAB — COMPREHENSIVE METABOLIC PANEL
Albumin: 4.8 (ref 3.5–5.0)
Calcium: 9.9 (ref 8.7–10.7)
Globulin: 2.1
eGFR: 106

## 2022-08-28 LAB — HEMOGLOBIN A1C: Hemoglobin A1C: 5

## 2022-08-28 LAB — CBC AND DIFFERENTIAL
HCT: 45 (ref 36–46)
Hemoglobin: 14.7 (ref 12.0–16.0)
Neutrophils Absolute: 4355
Platelets: 251 10*3/uL (ref 150–400)
WBC: 7.6

## 2022-08-28 LAB — VITAMIN D 25 HYDROXY (VIT D DEFICIENCY, FRACTURES): Vit D, 25-Hydroxy: 41

## 2022-08-28 LAB — IRON,TIBC AND FERRITIN PANEL
Ferritin: 83
Iron: 92

## 2022-08-28 LAB — LIPID PANEL
Cholesterol: 172 (ref 0–200)
HDL: 64 (ref 35–70)
LDL Cholesterol: 93
LDl/HDL Ratio: 2.7
Triglycerides: 67 (ref 40–160)

## 2022-08-28 LAB — CBC: RBC: 5.02 (ref 3.87–5.11)

## 2022-08-28 LAB — HEPATIC FUNCTION PANEL
Alkaline Phosphatase: 52 (ref 25–125)
Bilirubin, Total: 0.3

## 2022-08-28 LAB — TSH: TSH: 0.92 (ref 0.41–5.90)

## 2022-09-06 ENCOUNTER — Ambulatory Visit (INDEPENDENT_AMBULATORY_CARE_PROVIDER_SITE_OTHER): Payer: 59 | Admitting: Family Medicine

## 2022-09-12 ENCOUNTER — Ambulatory Visit (INDEPENDENT_AMBULATORY_CARE_PROVIDER_SITE_OTHER): Payer: 59 | Admitting: Family Medicine

## 2022-09-12 ENCOUNTER — Encounter (INDEPENDENT_AMBULATORY_CARE_PROVIDER_SITE_OTHER): Payer: Self-pay | Admitting: Family Medicine

## 2022-09-12 VITALS — BP 142/87 | HR 139 | Temp 98.2°F | Ht 62.0 in | Wt 161.0 lb

## 2022-09-12 DIAGNOSIS — Z6831 Body mass index (BMI) 31.0-31.9, adult: Secondary | ICD-10-CM

## 2022-09-12 DIAGNOSIS — F5089 Other specified eating disorder: Secondary | ICD-10-CM | POA: Diagnosis not present

## 2022-09-12 DIAGNOSIS — E7849 Other hyperlipidemia: Secondary | ICD-10-CM | POA: Diagnosis not present

## 2022-09-12 DIAGNOSIS — E559 Vitamin D deficiency, unspecified: Secondary | ICD-10-CM | POA: Diagnosis not present

## 2022-09-12 DIAGNOSIS — E669 Obesity, unspecified: Secondary | ICD-10-CM

## 2022-09-12 DIAGNOSIS — E88819 Insulin resistance, unspecified: Secondary | ICD-10-CM

## 2022-09-12 MED ORDER — VITAMIN D3 50 MCG (2000 UT) PO CAPS: 2000.0000 [IU] | ORAL_CAPSULE | Freq: Every day | ORAL | Status: AC

## 2022-09-12 MED ORDER — METFORMIN HCL 500 MG PO TABS: 500.0000 mg | ORAL_TABLET | Freq: Every day | ORAL | 0 refills | Status: DC

## 2022-09-12 NOTE — Progress Notes (Incomplete)
Krista Henson, D.O.  ABFM, ABOM Specializing in Clinical Bariatric Medicine  Office located at: [address], [place]  [address]     Assessment and Plan:   No orders of the defined types were placed in this encounter.   There are no discontinued medications.   No orders of the defined types were placed in this encounter.    Outside labs from 08/30/22 included a lipid panel, iron TIBC and ferritin, BMP, CBC, liver panel, TSH, and Vitamin D which were mostly WNL per review of patient's records on her phone.    Insulin resistance, with carbohydrate cravings Assessment: Condition is At goal.. Labs were reviewed. She states that her blood sugars have been better controlled.  Lab Results  Component Value Date   HGBA1C 5.0 02/26/2022   HGBA1C 4.8 09/21/2021   INSULIN 12.6 02/26/2022   INSULIN 11.2 09/21/2021  She reports compliance with Metformin '500mg'$  BID and is tolerating this well. She had labs done outside of our system on 08/30/22- A1c was 5.4 and fasting glucose was 79 at that time.  Plan: Continue Metformin '500mg'$  BID- Will refill medication today.   - Continue to decrease simple carbs/ sugars; increase fiber and proteins -> follow her meal plan.   - Explained role of simple carbs and insulin levels on hunger and cravings - Krista Henson will continue to work on weight loss, exercise, via their meal plan we devised to help decrease the risk of progressing to diabetes.  - We will recheck A1c and fasting insulin level in approximately 3 months from last check, or as deemed appropriate.     Obesity, Current BMI 31.2 Assessment: Condition is Improving, but not optimized.. Biometric data collected today, was reviewed with patient.  Fat mass has decreased by 6.2lb. Muscle mass has decreased by 3.2lb. Total body water is unchanged.  Plan: Continue portion control smart choices plan and exercise routine as tolerated and recommended by PT   She plans to increase walking  routine to 3 days a week as tolerated.   Other disorder of eating, with emotional eating Assessment: Condition is Controlled.. Denies any SI/HI. Mood is stable.  Cravings and hunger are well controlled. She states that her binge eating has significantly improved since starting Vyvanse for her ADHD.  Plan: -Continue Vyvanse as recommended by her PCP. - Reminded patient of the importance of following their prudent nutrition plan and how food can affect mood as well to support emotional wellbeing.  - We will continue to monitor closely alongside PCP / other specialists.    Vitamin D deficiency Assessment: Condition is Improving, but not optimized.. Labs were reviewed.  Lab Results  Component Value Date   VD25OH 49.5 02/26/2022   VD25OH 40.3 09/21/2021  Her Vit D was 41 on 08/30/22. She is taking a multivitamin but no other Vitamin D supplements.  Plan:Will start OTC Vit D 2000 IU daily in addition to her multivitamin. - I discussed the importance of vitamin D to the patient's health and well-being.  - ideal vitamin D levels reviewed with patient   - weight loss will likely improve availability of vitamin D, thus encouraged Krista Henson to continue with meal plan and their weight loss efforts to further improve this condition.  Thus, we will need to monitor levels regularly (every 3-4 mo on average) to keep levels within normal limits and prevent over supplementation. - pt's questions and concerns regarding this condition addressed.     Other hyperlipidemia Assessment: Condition is At  goal.. Labs were reviewed. Lab Results  Component Value Date   CHOL 191 02/26/2022   HDL 66 02/26/2022   LDLCALC 112 (H) 02/26/2022   TRIG 71 02/26/2022   CHOLHDL 2.9 02/26/2022  She had a lipid panel on 08/30/22 which we reviewed on her phone and the results were WNL- she will send in her lab results to our office.  Plan: This is diet controlled at this time. Krista Henson agrees to continue with our  treatment plan of a heart-heathy, low cholesterol meal plan - We will continue routine screening as patient continues to achieve health goals along their weight loss journey       TREATMENT PLAN FOR OBESITY:  Recommended Dietary Goals Krista Henson is currently in the action stage of change. As such, her goal is to continue weight management plan. She has agreed to continue practicing portion control and making smarter food choices, such as increasing vegetables and decreasing simple carbohydrates.  Behavioral Intervention Additional resources provided today:  patient declined Evidence-based interventions for health behavior change were utilized today including the discussion of self monitoring techniques, problem-solving barriers and SMART goal setting techniques.   Regarding patient's less desirable eating habits and patterns, we employed the technique of small changes.  Pt will specifically work on: increase walking regimen to 3 days a week as tolerated for next visit.    Recommended Physical Activity Goals Krista Henson has been advised to work up to 150 minutes of moderate intensity aerobic activity a week and strengthening exercises 2-3 times per week for cardiovascular health, weight loss maintenance and preservation of muscle mass.  She has agreed to increase physical activity in their day and reduce sedentary time (increase NEAT).    FOLLOW UP: No follow-ups on file. She was informed of the importance of frequent follow up visits to maximize her success with intensive lifestyle modifications for her multiple health conditions.  Subjective:   Chief complaint: Obesity Krista Henson is here to discuss her progress with her obesity treatment plan. She is on the practicing portion control and making smarter food choices, such as increasing vegetables and decreasing simple carbohydrates and states she is following her eating plan approximately 80% of the time. She states she is exercising 60 minutes 3 days  per week- 60 minutes of yoga 2 days a week and 60 minutes of walking 1 day per week.  Interval History:  Krista Henson is here for a follow up office visit. We reviewed her meal plan and all questions were answered. Patient's food recall appears to be accurate and consistent with what is on plan when she is following it. When eating on plan, her hunger and cravings are well controlled.     Since last office visit she has been doing very well with her nutritional plan and her binge eating has improved since starting Vyvanse for her ADHD. However, she had to cut back on exercise due to back pain secondary to herniated discs. She is being followed by a specialist and going to PT for this.   Review of Systems:  Pertinent positives were addressed with patient today.  Weight Summary and Biometrics   Weight Lost Since Last Visit: 9lb   Vitals Temp: 98.2 F (36.8 C) BP: (!) 142/87 Pulse Rate: (!) 139 SpO2: 99 %   Anthropometric Measurements Height: '5\' 2"'$  (1.575 m) Weight: 161 lb (73 kg) BMI (Calculated): 29.44 Weight at Last Visit: 170lb Weight Lost Since Last Visit: 9lb Starting Weight: 177lb Total Weight Loss (lbs): 16 lb (  7.258 kg) Peak Weight: 213lb   Body Composition  Body Fat %: 34.6 % Fat Mass (lbs): 55.8 lbs Muscle Mass (lbs): 99.8 lbs Total Body Water (lbs): 72.2 lbs Visceral Fat Rating : 6   Other Clinical Data Fasting: no Today's Visit #: 12 Starting Date: 09/21/21    Objective:   PHYSICAL EXAM:  Blood pressure (!) 142/87, pulse (!) 139, temperature 98.2 F (36.8 C), height '5\' 2"'$  (1.575 m), weight 161 lb (73 kg), SpO2 99 %. Body mass index is 29.45 kg/m.  General: Well Developed, well nourished, and in no acute distress.  HEENT: Normocephalic, atraumatic Skin: Warm and dry, cap RF less 2 sec, good turgor Chest:  Normal excursion, shape, no gross abn Respiratory: speaking in full sentences, no conversational dyspnea NeuroM-Sk: Ambulates w/o  assistance, moves * 4 Psych: A and O *3, insight good, mood-full  DIAGNOSTIC DATA REVIEWED:  BMET    Component Value Date/Time   NA 142 09/21/2021 1042   K 4.8 09/21/2021 1042   CL 101 09/21/2021 1042   CO2 25 09/21/2021 1042   GLUCOSE 83 09/21/2021 1042   BUN 16 09/21/2021 1042   CREATININE 0.75 09/21/2021 1042   CALCIUM 9.6 09/21/2021 1042   Lab Results  Component Value Date   HGBA1C 5.0 02/26/2022   HGBA1C 4.8 09/21/2021   Lab Results  Component Value Date   INSULIN 12.6 02/26/2022   INSULIN 11.2 09/21/2021   Lab Results  Component Value Date   TSH 1.350 09/21/2021   CBC No results found for: "WBC", "RBC", "HGB", "HCT", "PLT", "MCV", "MCH", "MCHC", "RDW" Iron Studies No results found for: "IRON", "TIBC", "FERRITIN", "IRONPCTSAT" Lipid Panel     Component Value Date/Time   CHOL 191 02/26/2022 1158   TRIG 71 02/26/2022 1158   HDL 66 02/26/2022 1158   CHOLHDL 2.9 02/26/2022 1158   LDLCALC 112 (H) 02/26/2022 1158   Hepatic Function Panel     Component Value Date/Time   PROT 6.5 09/21/2021 1042   ALBUMIN 4.7 09/21/2021 1042   AST 16 09/21/2021 1042   ALT 24 09/21/2021 1042   ALKPHOS 62 09/21/2021 1042   BILITOT 0.3 09/21/2021 1042      Component Value Date/Time   TSH 1.350 09/21/2021 1042   Nutritional Lab Results  Component Value Date   VD25OH 49.5 02/26/2022   VD25OH 40.3 09/21/2021    Attestations:   Reviewed by clinician on day of visit: allergies, medications, problem list, medical history, surgical history, family history, social history, and previous encounter notes.   Patient was in the office today and time spent on visit including pre-visit chart review and post-visit care/coordination of care and electronic medical record documentation was 40*** minutes. 50% of the time was in face to face counseling of this patient's medical condition(s) and providing education on treatment options to include the first-line treatment of diet and lifestyle  modification.   I,Alexis Herring,acting as a Education administrator for [employer], DO.,have documented all relevant documentation on the behalf of Mellody Dance, DO,as directed by  Mellody Dance, DO while in the presence of Mellody Dance, DO.   I, Mellody Dance, DO, have reviewed all documentation for this visit. The documentation on 09/12/22 for the exam, diagnosis, procedures, and orders are all accurate and complete.

## 2022-09-13 ENCOUNTER — Ambulatory Visit: Payer: 59 | Admitting: Adult Health

## 2022-09-13 DIAGNOSIS — G43709 Chronic migraine without aura, not intractable, without status migrainosus: Secondary | ICD-10-CM | POA: Diagnosis not present

## 2022-09-13 MED ORDER — ONABOTULINUMTOXINA 200 UNITS IJ SOLR
155.0000 [IU] | Freq: Once | INTRAMUSCULAR | Status: AC
  Administered 2022-09-13: 155 [IU] via INTRAMUSCULAR

## 2022-09-13 NOTE — Progress Notes (Signed)
Botox- 200 units x 1 vial Lot: [id number] Expiration: 11/2024 NDC: [id number]  Bacteriostatic 0.9% Sodium Chloride- 4 mL total Lot: [id number] Expiration: 05/2024  NDC: [id number]  Dx: [id number] B/B Witnessed by Claiborne Rigg

## 2022-09-13 NOTE — Progress Notes (Signed)
09/12/21:  More frequent headaches but not as severe. On Qulipta and feels that it has helped. Having 2-3 milder headaches a week.  Headaches are typically in the shoulders radiating up the neck to the back of the head.  Today she wants me to inject the trapezius muscle  03/15/22: more headaches this last month. Fortunately they have been mild.  Wondering if she should increase atenolol.  She is already had a good dose.  May consider adding on Qulipta in the future  09/06/21: Reports that migraines have decreased since she started Botox therapy.    BOTOX PROCEDURE NOTE FOR MIGRAINE HEADACHE    Contraindications and precautions discussed with patient(above). Aseptic procedure was observed and patient tolerated procedure. Procedure performed by Ward Givens, NP  The condition has existed for more than 6 months, and pt does not have a diagnosis of ALS, Myasthenia Gravis or Lambert-Eaton Syndrome.  Risks and benefits of injections discussed and pt agrees to proceed with the procedure.  Written consent obtained  These injections are medically necessary.  These injections do not cause sedations or hallucinations which the oral therapies may cause.  Indication/Diagnosis: chronic migraine BOTOX([id number]) injection was performed according to protocol by [employer]. 200 units of BOTOX was dissolved into 4 cc NS.   NDC: [id number]  Type of toxin: Botox Botox- 200 units x 1 vial Lot: [id number] Expiration: 11/2024 NDC: [id number]   Bacteriostatic 0.9% Sodium Chloride- 4 mL total Lot: [id number] Expiration: 05/2024  NDC: [id number]   Dx: [id number]    Description of procedure:  The patient was placed in a sitting position. The standard protocol was used for Botox as follows, with 5 units of Botox injected at each site:   -Procerus muscle, midline injection  -Corrugator muscle, bilateral injection  -Frontalis muscle, bilateral injection, with 2 sites each side, medial injection was  performed in the upper one third of the frontalis muscle, in the region vertical from the medial inferior edge of the superior orbital rim. The lateral injection was again in the upper one third of the forehead vertically above the lateral limbus of the cornea, 1.5 cm lateral to the medial injection site. 1 unit placed between slighty below two injections.   -Temporalis muscle injection, 4 sites, bilaterally. The first injection was 3 cm above the tragus of the ear, second injection site was 1.5 cm to 3 cm up from the first injection site in line with the tragus of the ear. The third injection site was 1.5-3 cm forward between the first 2 injection sites. The fourth injection site was 1.5 cm posterior to the second injection site.  -Occipitalis muscle injection, 3 sites, bilaterally. The first injection was done one half way between the occipital protuberance and the tip of the mastoid process behind the ear. The second injection site was done lateral and superior to the first, 1 fingerbreadth from the first injection. The third injection site was 1 fingerbreadth superiorly and medially from the first injection site.  -Cervical paraspinal muscle injection, 2 sites, bilateral  first injection site was 1 cm from the midline of the cervical spine, 3 cm inferior to the lower border of the occipital protuberance. The second injection site was 1.5 cm superiorly and laterally to the first injection site.   -Trapezius muscle injection was performed at 3 sites, bilaterally. The first injection site was in the upper trapezius muscle halfway between the inflection point of the neck, and the acromion. The second injection site was one half way between the  acromion and the first injection site. The third injection was done between the first injection site and the inflection point of the neck.    Will return for repeat injection in 3 months.   A 200 units of Botox was used, 155 units were injected, the rest of the  Botox was wasted. The patient tolerated the procedure well, there were no complications of the above procedure.  Ward Givens, MSN, NP-C 09/13/2022, 3:09 PM [facility] [address] [place], [place] [address] [phone]

## 2022-09-20 ENCOUNTER — Other Ambulatory Visit (INDEPENDENT_AMBULATORY_CARE_PROVIDER_SITE_OTHER): Payer: Self-pay | Admitting: Family Medicine

## 2022-09-20 ENCOUNTER — Encounter (INDEPENDENT_AMBULATORY_CARE_PROVIDER_SITE_OTHER): Payer: Self-pay | Admitting: Family Medicine

## 2022-09-24 ENCOUNTER — Encounter: Payer: Self-pay | Admitting: Adult Health

## 2022-09-25 NOTE — Telephone Encounter (Signed)
CMM KEY: Key: [id number]   Awaiting clinical request from [place]

## 2022-09-26 NOTE — Telephone Encounter (Signed)
PA submitted to [employer]. Key: [id number]. Expect determination within 1-5 business days.

## 2022-10-22 ENCOUNTER — Ambulatory Visit (INDEPENDENT_AMBULATORY_CARE_PROVIDER_SITE_OTHER): Payer: 59 | Admitting: Family Medicine

## 2022-10-22 ENCOUNTER — Encounter (INDEPENDENT_AMBULATORY_CARE_PROVIDER_SITE_OTHER): Payer: Self-pay | Admitting: Family Medicine

## 2022-10-22 VITALS — BP 107/73 | HR 98 | Temp 98.2°F | Ht 62.0 in | Wt 161.0 lb

## 2022-10-22 DIAGNOSIS — E559 Vitamin D deficiency, unspecified: Secondary | ICD-10-CM

## 2022-10-22 DIAGNOSIS — Z6829 Body mass index (BMI) 29.0-29.9, adult: Secondary | ICD-10-CM

## 2022-10-22 DIAGNOSIS — E669 Obesity, unspecified: Secondary | ICD-10-CM

## 2022-10-22 DIAGNOSIS — E88819 Insulin resistance, unspecified: Secondary | ICD-10-CM | POA: Diagnosis not present

## 2022-10-22 MED ORDER — METFORMIN HCL 500 MG PO TABS: ORAL_TABLET | ORAL | 0 refills | Status: DC

## 2022-10-22 NOTE — Progress Notes (Signed)
Krista Henson, D.O.  ABFM, ABOM Specializing in Clinical Bariatric Medicine  Office located at: [address] [place], [place]  [address]     Assessment and Plan:   No orders of the defined types were placed in this encounter.   Medications Discontinued During This Encounter  Medication Reason   lisdexamfetamine (VYVANSE) 40 MG capsule Patient Preference   metFORMIN (GLUCOPHAGE) 500 MG tablet Reorder     Meds ordered this encounter  Medications   metFORMIN (GLUCOPHAGE) 500 MG tablet    Sig: 1 po at lunch and dinner daily    Dispense:  60 tablet    Refill:  0    30 d supply;  ** OV for RF **   Do not send RF request     Insulin resistance, with carbohydrate cravings Assessment: Condition is stable.  Lab Results  Component Value Date   HGBA1C 5.0 08/28/2022   HGBA1C 5.0 02/26/2022   HGBA1C 4.8 09/21/2021   INSULIN 12.6 02/26/2022   INSULIN 11.2 09/21/2021  She endorses she has been taking Metformin 500 mg once daily at breakfast..Denies any side effects. She reports that her sugar was low on a few occasions, which she attributes is likely due to not eating enough food those days. Patient endorses that her hunger and cravings are mostly controlled. However, she reports having increased hormonal cravings when her period approaches.  Plan: Continue with medication. Will refill this today.   - Continuing monitoring blood sugar at home. - I advised patient that she can increase her Metformin dosage to BID a week before her period to help with her hormonal cravings.  - Continue to decrease simple carbs/ sugars; increase fiber and proteins -> follow her meal plan.   Krista Henson will continue to work on weight loss, exercise, via their meal plan we devised to help decrease the risk of progressing to diabetes.    Vitamin D deficiency Assessment: Condition is not optimized. Lab Results  Component Value Date   VD25OH 41 08/28/2022   VD25OH 49.5 02/26/2022   VD25OH 40.3  09/21/2021  She reports better compliance with OTC Vitamin D 5000 daily and OTC multivitamins.   Plan:Continue with meds.  - I recommended her to take the OTC Vitamin D every other day.  - weight loss will likely improve availability of vitamin D, thus encouraged [name] to continue with meal plan and their weight loss efforts to further improve this condition.     TREATMENT PLAN FOR OBESITY: Obesity, Current BMI 29.4-start bmi 32.37/date 09/21/21 Assessment: Condition is stable. Biometric data collected today, was reviewed with patient.  Fat mass has increased by 0.2lb. Muscle mass has not changed. Total body water has increased by 1lb.   Plan:  Krista Henson is currently in the action stage of change. As such, her goal is to continue weight management plan. Krista Henson will work on [employer] habits and continue with practicing portion control and making smarter food choices, such as increasing vegetables and decreasing simple carbohydrates.  We discussed ways that patient can increase her protein intake.   Behavioral Intervention Additional resources provided today: patient declined Evidence-based interventions for health behavior change were utilized today including the discussion of self monitoring techniques, problem-solving barriers and SMART goal setting techniques.   Regarding patient's less desirable eating habits and patterns, we employed the technique of small changes.  Pt will specifically work on: maintaining current meal plan and exercise regiment for next visit.    Recommended Physical Activity Goals  Krista Henson has been advised to work up to 150 minutes of moderate intensity aerobic activity a week and strengthening exercises 2-3 times per week for cardiovascular health, weight loss maintenance and preservation of muscle mass.  She has agreed to Continue current level of physical activity   FOLLOW UP: Return in about 4 weeks (around 11/19/2022). She was informed of the importance of  frequent follow up visits to maximize her success with intensive lifestyle modifications for her multiple health conditions.   Subjective:   Chief complaint: Obesity Kaylana is here to discuss her progress with her obesity treatment plan. She is on the practicing portion control and making smarter food choices, such as increasing vegetables and decreasing simple carbohydrates and states she is following her eating plan approximately 75% of the time. She states she is doing yoga/walking 60-120 minutes 2-3 days per week.  Interval History:  Krista Henson is here for a follow up office visit. Since last office visit she endorses being stressed due to familial problems and is seeing her therapist every 1-2 weeks. She has been trying to work on her self. She endorses that her hormonal cravings have increased.   Pharmacotherapy for weight loss: She is currently taking  Metformin  for medical weight loss.  Denies side effects.    Review of Systems:  Pertinent positives were addressed with patient today.   Weight Summary and Biometrics   Weight Lost Since Last Visit: 0  Weight Gained Since Last Visit: 0    Vitals Temp: 98.2 F (36.8 C) BP: 107/73 Pulse Rate: 98 SpO2: 100 %   Anthropometric Measurements Height: 5\' 2"  (1.575 m) Weight: 161 lb (73 kg) BMI (Calculated): 29.44 Weight at Last Visit: 161lb Weight Lost Since Last Visit: 0 Weight Gained Since Last Visit: 0 Starting Weight: 177lb Total Weight Loss (lbs): 16 lb (7.258 kg) Peak Weight: 213   Body Composition  Body Fat %: 34.7 % Fat Mass (lbs): 56 lbs Muscle Mass (lbs): 99.8 lbs Total Body Water (lbs): 73.2 lbs Visceral Fat Rating : 6   Other Clinical Data Fasting: no Labs: no Today's Visit #: 13 Starting Date: 09/21/21     Objective:   PHYSICAL EXAM: Blood pressure 107/73, pulse 98, temperature 98.2 F (36.8 C), height 5\' 2"  (1.575 m), weight 161 lb (73 kg), SpO2 100 %. Body mass index is 29.45  kg/m.  General: Well Developed, well nourished, and in no acute distress.  HEENT: Normocephalic, atraumatic Skin: Warm and dry, cap RF less 2 sec, good turgor Chest:  Normal excursion, shape, no gross abn Respiratory: speaking in full sentences, no conversational dyspnea NeuroM-Sk: Ambulates w/o assistance, moves * 4 Psych: A and O *3, insight good, mood-full  DIAGNOSTIC DATA REVIEWED:  BMET    Component Value Date/Time   NA 139 08/28/2022 0000   K 4.0 08/28/2022 0000   CL 103 08/28/2022 0000   CO2 26 (A) 08/28/2022 0000   GLUCOSE 83 09/21/2021 1042   BUN 10 08/28/2022 0000   CREATININE 0.8 08/28/2022 0000   CREATININE 0.75 09/21/2021 1042   CALCIUM 9.9 08/28/2022 0000   Lab Results  Component Value Date   HGBA1C 5.0 08/28/2022   HGBA1C 4.8 09/21/2021   Lab Results  Component Value Date   INSULIN 12.6 02/26/2022   INSULIN 11.2 09/21/2021   Lab Results  Component Value Date   TSH 0.92 08/28/2022   CBC    Component Value Date/Time   WBC 7.6 08/28/2022 0000   RBC 5.02 08/28/2022 0000  HGB 14.7 08/28/2022 0000   HCT 45 08/28/2022 0000   PLT 251 08/28/2022 0000   Iron Studies    Component Value Date/Time   IRON 92 08/28/2022 0000   FERRITIN 83 08/28/2022 0000   Lipid Panel     Component Value Date/Time   CHOL 172 08/28/2022 0000   CHOL 191 02/26/2022 1158   TRIG 67 08/28/2022 0000   HDL 64 08/28/2022 0000   HDL 66 02/26/2022 1158   CHOLHDL 2.9 02/26/2022 1158   LDLCALC 93 08/28/2022 0000   LDLCALC 112 (H) 02/26/2022 1158   Hepatic Function Panel     Component Value Date/Time   PROT 6.5 09/21/2021 1042   ALBUMIN 4.8 08/28/2022 0000   ALBUMIN 4.7 09/21/2021 1042   AST 16 09/21/2021 1042   ALT 24 09/21/2021 1042   ALKPHOS 52 08/28/2022 0000   BILITOT 0.3 09/21/2021 1042      Component Value Date/Time   TSH 0.92 08/28/2022 0000   TSH 1.350 09/21/2021 1042   Nutritional Lab Results  Component Value Date   VD25OH 41 08/28/2022   VD25OH 49.5  02/26/2022   VD25OH 40.3 09/21/2021    Attestations:   Reviewed by clinician on day of visit: allergies, medications, problem list, medical history, surgical history, family history, social history, and previous encounter notes.  I,Special Puri,acting as a Neurosurgeon for [employer], DO.,have documented all relevant documentation on the behalf of Thomasene Lot, DO,as directed by  Thomasene Lot, DO while in the presence of Thomasene Lot, DO.   I, Thomasene Lot, DO, have reviewed all documentation for this visit. The documentation on 10/22/22 for the exam, diagnosis, procedures, and orders are all accurate and complete.

## 2022-11-05 ENCOUNTER — Other Ambulatory Visit (INDEPENDENT_AMBULATORY_CARE_PROVIDER_SITE_OTHER): Payer: Self-pay | Admitting: Family Medicine

## 2022-11-05 DIAGNOSIS — E88819 Insulin resistance, unspecified: Secondary | ICD-10-CM

## 2022-11-07 ENCOUNTER — Telehealth: Payer: Self-pay | Admitting: Adult Health

## 2022-11-07 NOTE — Telephone Encounter (Signed)
Received fax with approval: case # [id number] (11/07/22-11/07/23)

## 2022-11-07 NOTE — Telephone Encounter (Signed)
Initiated new auth in anticipation of 12/06/22 Botox appt, current auth expires 12/05/22. Can take up to 5 business days to hear back. Key: [employer] [place]

## 2022-11-20 ENCOUNTER — Ambulatory Visit (INDEPENDENT_AMBULATORY_CARE_PROVIDER_SITE_OTHER): Payer: 59 | Admitting: Family Medicine

## 2022-11-29 ENCOUNTER — Encounter: Payer: Self-pay | Admitting: Adult Health

## 2022-11-29 NOTE — Telephone Encounter (Signed)
I spoke with [facility] and found out that her Krista Henson is covered in full by insurance but [facility] is no longer contracted with her insurance plan. Pt will need to fill at a local pharmacy. I had them transfer the Qulipta to [facility] [address] in [place] for the patient.

## 2022-12-03 ENCOUNTER — Ambulatory Visit (INDEPENDENT_AMBULATORY_CARE_PROVIDER_SITE_OTHER): Payer: 59 | Admitting: Family Medicine

## 2022-12-03 ENCOUNTER — Encounter (INDEPENDENT_AMBULATORY_CARE_PROVIDER_SITE_OTHER): Payer: Self-pay

## 2022-12-03 NOTE — Progress Notes (Incomplete)
Carlye Grippe, D.O.  ABFM, ABOM Specializing in Clinical Bariatric Medicine  Office located at: [address] [place], [place]  [address]     Assessment and Plan:   No orders of the defined types were placed in this encounter.   There are no discontinued medications.   No orders of the defined types were placed in this encounter.    *** There are no diagnoses linked to this encounter.     TREATMENT PLAN FOR OBESITY:   Assessment:  Krista Henson is here to discuss her progress with her obesity treatment plan along with follow-up of her obesity related diagnoses. See Medical Weight Management Flowsheet for complete bioelectrical impedance results.  Condition is {docourse:29403:::1}. {BiometricData (Optional):29179}  Since last office visit on *** patient's  Muscle mass has {DID:29233} by ***lb. Fat mass has {DID:29233} by ***lb. Total body water has {DID:29233} by ***lb.  Counseling done on how various foods will affect these numbers and how to maximize success  Total lbs lost to date: *** Total weight loss percentage to date: *** ( use https://www.[employer].si )  Plan:  Krista Henson is currently in the action stage of change. As such, her goal is to continue her weight management plan. Little will work on healthier eating habits and try to follow the {mealplan:29239} best they can.   Behavioral Intervention Additional resources provided today: {weightresources:29185} Evidence-based interventions for health behavior change were utilized today including the discussion of self monitoring techniques, problem-solving barriers and SMART goal setting techniques.   Regarding patient's less desirable eating habits and patterns, we employed the technique of small changes.  Pt will specifically work on: *** for next visit.    Recommended Physical Activity Goals  Krista Henson has been advised to slowly work up to 150 minutes of moderate  intensity aerobic activity a week and strengthening exercises 2-3 times per week for cardiovascular health, weight loss maintenance and preservation of muscle mass.   She has agreed to {EMEXERCISE:28847::"Think about ways to increase daily physical activity and overcoming barriers to exercise"}  Pharmacotherapy Current Anti-obesity medications: ***. Reported side effects: ***. We discussed various medication options to help Krista Henson with her weight loss efforts and we both agreed to ***.( or Patient prefers to not start any weight loss medications at this time.   FOLLOW UP: No follow-ups on file.*** She was informed of the importance of frequent follow up visits to maximize her success with intensive lifestyle modifications for her multiple health conditions.  Subjective:   Chief complaint: Obesity Krista Henson is here to discuss her progress with her obesity treatment plan. She is on the {MWMwtlossportion/plan2:23431} and states she is following her eating plan approximately ***% of the time. She states she is exercising *** minutes *** days per week.  Interval History:  Krista Henson is here for a follow up office visit.     Since last office visit:  ***  We reviewed her meal plan and all questions were answered. Patient's food recall appears to be accurate and consistent with what is on plan when she is following it. When eating on plan, her hunger and cravings are well controlled.      Pharmacotherapy for weight loss: She {srtis (Optional):29129} currently taking {srtpreviousweightlossmeds (Optional):29124} for medical weight loss.  Denies side effects.    Review of Systems:  Pertinent positives were addressed with patient today.  Weight Summary and Biometrics   No data recorded No data recorded ***  No data recorded No data recorded No data recorded  No data recorded   Objective:   PHYSICAL EXAM: There were no vitals taken for this visit. There is no height or weight on  file to calculate BMI.  General: Well Developed, well nourished, and in no acute distress.  HEENT: Normocephalic, atraumatic Skin: Warm and dry, cap RF less 2 sec, good turgor Chest:  Normal excursion, shape, no gross abn Respiratory: speaking in full sentences, no conversational dyspnea NeuroM-Sk: Ambulates w/o assistance, moves * 4 Psych: A and O *3, insight good, mood-full  DIAGNOSTIC DATA REVIEWED:  BMET    Component Value Date/Time   NA 139 08/28/2022 0000   K 4.0 08/28/2022 0000   CL 103 08/28/2022 0000   CO2 26 (A) 08/28/2022 0000   GLUCOSE 83 09/21/2021 1042   BUN 10 08/28/2022 0000   CREATININE 0.8 08/28/2022 0000   CREATININE 0.75 09/21/2021 1042   CALCIUM 9.9 08/28/2022 0000   Lab Results  Component Value Date   HGBA1C 5.0 08/28/2022   HGBA1C 4.8 09/21/2021   Lab Results  Component Value Date   INSULIN 12.6 02/26/2022   INSULIN 11.2 09/21/2021   Lab Results  Component Value Date   TSH 0.92 08/28/2022   CBC    Component Value Date/Time   WBC 7.6 08/28/2022 0000   RBC 5.02 08/28/2022 0000   HGB 14.7 08/28/2022 0000   HCT 45 08/28/2022 0000   PLT 251 08/28/2022 0000   Iron Studies    Component Value Date/Time   IRON 92 08/28/2022 0000   FERRITIN 83 08/28/2022 0000   Lipid Panel     Component Value Date/Time   CHOL 172 08/28/2022 0000   CHOL 191 02/26/2022 1158   TRIG 67 08/28/2022 0000   HDL 64 08/28/2022 0000   HDL 66 02/26/2022 1158   CHOLHDL 2.9 02/26/2022 1158   LDLCALC 93 08/28/2022 0000   LDLCALC 112 (H) 02/26/2022 1158   Hepatic Function Panel     Component Value Date/Time   PROT 6.5 09/21/2021 1042   ALBUMIN 4.8 08/28/2022 0000   ALBUMIN 4.7 09/21/2021 1042   AST 16 09/21/2021 1042   ALT 24 09/21/2021 1042   ALKPHOS 52 08/28/2022 0000   BILITOT 0.3 09/21/2021 1042      Component Value Date/Time   TSH 0.92 08/28/2022 0000   TSH 1.350 09/21/2021 1042   Nutritional Lab Results  Component Value Date   VD25OH 41  08/28/2022   VD25OH 49.5 02/26/2022   VD25OH 40.3 09/21/2021    Attestations:   Reviewed by clinician on day of visit: allergies, medications, problem list, medical history, surgical history, family history, social history, and previous encounter notes.   Patient was in the office today and time spent on visit including pre-visit chart review and post-visit care/coordination of care and electronic medical record documentation was *** minutes. 50% of the time was in face to face counseling of this patient's medical condition(s) and providing education on treatment options to include the first-line treatment of diet and lifestyle modification.   I,Special Puri,acting as a Neurosurgeon for [employer], DO.,have documented all relevant documentation on the behalf of Thomasene Lot, DO,as directed by  Thomasene Lot, DO while in the presence of Thomasene Lot, DO.   I, Thomasene Lot, DO, have reviewed all documentation for this visit. The documentation on 12/03/22 for the exam, diagnosis, procedures, and orders are all accurate and complete.

## 2022-12-06 ENCOUNTER — Other Ambulatory Visit: Payer: Self-pay | Admitting: *Deleted

## 2022-12-06 ENCOUNTER — Other Ambulatory Visit: Payer: Self-pay | Admitting: Adult Health

## 2022-12-06 ENCOUNTER — Ambulatory Visit: Payer: 59 | Admitting: Adult Health

## 2022-12-06 DIAGNOSIS — G43709 Chronic migraine without aura, not intractable, without status migrainosus: Secondary | ICD-10-CM

## 2022-12-06 MED ORDER — DICLOFENAC POTASSIUM 25 MG PO CAPS: 1.0000 | ORAL_CAPSULE | ORAL | 1 refills | Status: DC | PRN

## 2022-12-06 MED ORDER — RIZATRIPTAN BENZOATE 10 MG PO TBDP: ORAL_TABLET | ORAL | 11 refills | Status: DC

## 2022-12-06 MED ORDER — ONDANSETRON 4 MG PO TBDP: 4.0000 mg | ORAL_TABLET | Freq: Three times a day (TID) | ORAL | 11 refills | Status: DC | PRN

## 2022-12-06 MED ORDER — ONABOTULINUMTOXINA 200 UNITS IJ SOLR
155.0000 [IU] | Freq: Once | INTRAMUSCULAR | Status: AC
  Administered 2022-12-06: 155 [IU] via INTRAMUSCULAR

## 2022-12-06 NOTE — Progress Notes (Signed)
12/06/22: Headaches have been better. Started dry needling and that has been helpful. Has approximately 1 migraine a month.   09/12/21:  More frequent headaches but not as severe. On Qulipta and feels that it has helped. Having 2-3 milder headaches a week.  Headaches are typically in the shoulders radiating up the neck to the back of the head.  Today she wants me to inject the trapezius muscle  03/15/22: more headaches this last month. Fortunately they have been mild.  Wondering if she should increase atenolol.  She is already had a good dose.  May consider adding on Qulipta in the future  09/06/21: Reports that migraines have decreased since she started Botox therapy.    BOTOX PROCEDURE NOTE FOR MIGRAINE HEADACHE    Contraindications and precautions discussed with patient(above). Aseptic procedure was observed and patient tolerated procedure. Procedure performed by Butch Penny, NP  The condition has existed for more than 6 months, and pt does not have a diagnosis of ALS, Myasthenia Gravis or Lambert-Eaton Syndrome.  Risks and benefits of injections discussed and pt agrees to proceed with the procedure.  Written consent obtained  These injections are medically necessary.  These injections do not cause sedations or hallucinations which the oral therapies may cause.  Indication/Diagnosis: chronic migraine BOTOX(J0585) injection was performed according to protocol by [employer]. 200 units of BOTOX was dissolved into 4 cc NS.   NDC: [address]-11  Type of toxin: Botox Botox- 200 units x 1 vial Lot: [id number] Expiration: 12/2024 NDC: 6213-0865-78   Bacteriostatic 0.9% Sodium Chloride- * mL  Lot: [id number] Expiration: 10/2023 NDC: 5284-1324-40   Dx: N02.725    Description of procedure:  The patient was placed in a sitting position. The standard protocol was used for Botox as follows, with 5 units of Botox injected at each site:   -Procerus muscle, midline injection  -Corrugator  muscle, bilateral injection  -Frontalis muscle, bilateral injection, with 2 sites each side, medial injection was performed in the upper one third of the frontalis muscle, in the region vertical from the medial inferior edge of the superior orbital rim. The lateral injection was again in the upper one third of the forehead vertically above the lateral limbus of the cornea, 1.5 cm lateral to the medial injection site. 1 unit placed between slighty below two injections.   -Temporalis muscle injection, 4 sites, bilaterally. The first injection was 3 cm above the tragus of the ear, second injection site was 1.5 cm to 3 cm up from the first injection site in line with the tragus of the ear. The third injection site was 1.5-3 cm forward between the first 2 injection sites. The fourth injection site was 1.5 cm posterior to the second injection site.  -Occipitalis muscle injection, 3 sites, bilaterally. The first injection was done one half way between the occipital protuberance and the tip of the mastoid process behind the ear. The second injection site was done lateral and superior to the first, 1 fingerbreadth from the first injection. The third injection site was 1 fingerbreadth superiorly and medially from the first injection site.  -Cervical paraspinal muscle injection, 2 sites, bilateral  first injection site was 1 cm from the midline of the cervical spine, 3 cm inferior to the lower border of the occipital protuberance. The second injection site was 1.5 cm superiorly and laterally to the first injection site.   -Trapezius muscle injection was performed at 3 sites, bilaterally. The first injection site was in the upper trapezius muscle halfway  between the inflection point of the neck, and the acromion. The second injection site was one half way between the acromion and the first injection site. The third injection was done between the first injection site and the inflection point of the neck.    Will  return for repeat injection in 3 months.   A 200 units of Botox was used, 155 units were injected, the rest of the Botox was wasted. The patient tolerated the procedure well, there were no complications of the above procedure.  Butch Penny, MSN, NP-C 12/06/2022, 9:49 AM [facility] [address] [place], [place] [address] [phone]

## 2022-12-06 NOTE — Addendum Note (Signed)
Addended by: Raynald Kemp A on: 12/06/2022 12:03 PM   Modules accepted: Orders

## 2022-12-06 NOTE — Progress Notes (Signed)
Botox- 200 units x 1 vial Lot: [id number] Expiration: 12/2024 NDC: 9629-5284-13  Bacteriostatic 0.9% Sodium Chloride- * mL  Lot: [id number] Expiration: 10/2023 NDC: 2725-3664-40  Dx: H47.425  B/B Witnessed by Alverda Skeans, RN

## 2022-12-10 MED ORDER — DICLOFENAC SODIUM 25 MG PO TBEC: 25.0000 mg | DELAYED_RELEASE_TABLET | ORAL | 2 refills | Status: DC | PRN

## 2022-12-12 ENCOUNTER — Other Ambulatory Visit: Payer: Self-pay | Admitting: *Deleted

## 2022-12-12 MED ORDER — RIZATRIPTAN BENZOATE 10 MG PO TBDP: ORAL_TABLET | ORAL | 11 refills | Status: AC

## 2022-12-12 MED ORDER — QULIPTA 60 MG PO TABS: 60.0000 mg | ORAL_TABLET | Freq: Every day | ORAL | 5 refills | Status: DC

## 2022-12-12 MED ORDER — ONDANSETRON 4 MG PO TBDP: 4.0000 mg | ORAL_TABLET | Freq: Three times a day (TID) | ORAL | 11 refills | Status: AC | PRN

## 2022-12-12 MED ORDER — DICLOFENAC SODIUM 25 MG PO TBEC: 25.0000 mg | DELAYED_RELEASE_TABLET | ORAL | 2 refills | Status: AC | PRN

## 2022-12-12 NOTE — Addendum Note (Signed)
Addended by: Bertram Savin on: 12/12/2022 11:49 AM   Modules accepted: Orders

## 2022-12-19 ENCOUNTER — Other Ambulatory Visit (INDEPENDENT_AMBULATORY_CARE_PROVIDER_SITE_OTHER): Payer: Self-pay | Admitting: Family Medicine

## 2022-12-19 DIAGNOSIS — E88819 Insulin resistance, unspecified: Secondary | ICD-10-CM

## 2023-03-04 NOTE — Progress Notes (Unsigned)
03/05/23: Reports that Botox is working well.  She states that she typically gets more headaches a week before Botox is due.  Weather can also be a trigger for her.  She continues with dry needling and that has been helpful as well.  12/06/22: Headaches have been better. Started dry needling and that has been helpful. Has approximately 1 migraine a month.   09/12/21:  More frequent headaches but not as severe. On Qulipta and feels that it has helped. Having 2-3 milder headaches a week.  Headaches are typically in the shoulders radiating up the neck to the back of the head.  Today she wants me to inject the trapezius muscle  03/15/22: more headaches this last month. Fortunately they have been mild.  Wondering if she should increase atenolol.  She is already had a good dose.  May consider adding on Qulipta in the future  09/06/21: Reports that migraines have decreased since she started Botox therapy.    BOTOX PROCEDURE NOTE FOR MIGRAINE HEADACHE    Contraindications and precautions discussed with patient(above). Aseptic procedure was observed and patient tolerated procedure. Procedure performed by Butch Penny, NP  The condition has existed for more than 6 months, and pt does not have a diagnosis of ALS, Myasthenia Gravis or Lambert-Eaton Syndrome.  Risks and benefits of injections discussed and pt agrees to proceed with the procedure.  Written consent obtained  These injections are medically necessary.  These injections do not cause sedations or hallucinations which the oral therapies may cause.  Indication/Diagnosis: chronic migraine BOTOX(J0585) injection was performed according to protocol by [facility]. 200 units of BOTOX was dissolved into 4 cc NS.   NDC: [address]-40  Type of toxin: Botox Botox 200 units x 1 vial   LOT #: [id number] NDC: 8295-6213-08 EXP: 06/2025   B/B   Bacteriostatic 0.9% Sodium Chloride   LOT #: [id number] EXP: 10/01/2023 NDC: 9629-5284-13   Description of  procedure:  The patient was placed in a sitting position. The standard protocol was used for Botox as follows, with 5 units of Botox injected at each site:   -Procerus muscle, midline injection  -Corrugator muscle, bilateral injection  -Frontalis muscle, bilateral injection, with 2 sites each side, medial injection was performed in the upper one third of the frontalis muscle, in the region vertical from the medial inferior edge of the superior orbital rim. The lateral injection was again in the upper one third of the forehead vertically above the lateral limbus of the cornea, 1.5 cm lateral to the medial injection site. 1 unit placed between slighty below two injections.   -Temporalis muscle injection, 4 sites, bilaterally. The first injection was 3 cm above the tragus of the ear, second injection site was 1.5 cm to 3 cm up from the first injection site in line with the tragus of the ear. The third injection site was 1.5-3 cm forward between the first 2 injection sites. The fourth injection site was 1.5 cm posterior to the second injection site.  -Occipitalis muscle injection, 3 sites, bilaterally. The first injection was done one half way between the occipital protuberance and the tip of the mastoid process behind the ear. The second injection site was done lateral and superior to the first, 1 fingerbreadth from the first injection. The third injection site was 1 fingerbreadth superiorly and medially from the first injection site.  -Cervical paraspinal muscle injection, 2 sites, bilateral  first injection site was 1 cm from the midline of the cervical spine, 3 cm  inferior to the lower border of the occipital protuberance. The second injection site was 1.5 cm superiorly and laterally to the first injection site.   -Trapezius muscle injection was performed at 3 sites, bilaterally. The first injection site was in the upper trapezius muscle halfway between the inflection point of the neck, and the  acromion. The second injection site was one half way between the acromion and the first injection site. The third injection was done between the first injection site and the inflection point of the neck.    Will return for repeat injection in 3 months.   A 200 units of Botox was used, 155 units were injected, the rest of the Botox was wasted. The patient tolerated the procedure well, there were no complications of the above procedure.  Butch Penny, MSN, NP-C 03/04/2023, 10:44 AM [facility] [address], [address] [place], [place] [address] [phone]

## 2023-03-05 ENCOUNTER — Ambulatory Visit: Payer: 59 | Admitting: Adult Health

## 2023-03-05 DIAGNOSIS — G43709 Chronic migraine without aura, not intractable, without status migrainosus: Secondary | ICD-10-CM

## 2023-03-05 DIAGNOSIS — G43109 Migraine with aura, not intractable, without status migrainosus: Secondary | ICD-10-CM

## 2023-03-05 MED ORDER — ONABOTULINUMTOXINA 200 UNITS IJ SOLR
155.0000 [IU] | Freq: Once | INTRAMUSCULAR | Status: AC
Start: 2023-03-05 — End: 2023-03-05
  Administered 2023-03-05: 155 [IU] via INTRAMUSCULAR

## 2023-03-05 NOTE — Progress Notes (Signed)
Botox 200 units x 1 vial  LOT #: [id number] NDC: 8657-8469-62 EXP: 06/2025  B/B  Bacteriostatic 0.9% Sodium Chloride  LOT #: [id number] EXP: 10/01/2023 NDC: 3244-0102-72   Witnessed by Randa Evens.

## 2023-05-20 ENCOUNTER — Telehealth: Payer: Self-pay | Admitting: Adult Health

## 2023-05-20 NOTE — Telephone Encounter (Signed)
LVM and sent mychart msg informing pt of appt change- NP out 12/10.

## 2023-06-05 ENCOUNTER — Ambulatory Visit: Payer: 59 | Admitting: Adult Health

## 2023-06-11 ENCOUNTER — Ambulatory Visit: Payer: 59 | Admitting: Adult Health

## 2023-06-12 ENCOUNTER — Ambulatory Visit: Payer: 59 | Admitting: Adult Health

## 2023-06-12 DIAGNOSIS — G43709 Chronic migraine without aura, not intractable, without status migrainosus: Secondary | ICD-10-CM

## 2023-06-12 MED ORDER — ONABOTULINUMTOXINA 200 UNITS IJ SOLR
155.0000 [IU] | Freq: Once | INTRAMUSCULAR | Status: AC
  Administered 2023-06-12: 155 [IU] via INTRAMUSCULAR

## 2023-06-12 NOTE — Progress Notes (Signed)
Botox- 200 units x 1 vial Lot: [id number] Expiration: 08/2025 NDC: 8469-6295-28  Bacteriostatic 0.9% Sodium Chloride- * mL  Lot: [id number] Expiration: 10/01/2023 NDC: 0102-7253-66  Dx: Y40.347 B/B Witnessed by Leeann Must RN

## 2023-06-12 NOTE — Progress Notes (Signed)
06/12/23: Botox continues to work well. No new issues.   03/05/23: Reports that Botox is working well.  She states that she typically gets more headaches a week before Botox is due.  Weather can also be a trigger for her.  She continues with dry needling and that has been helpful as well.  12/06/22: Headaches have been better. Started dry needling and that has been helpful. Has approximately 1 migraine a month.   09/12/21:  More frequent headaches but not as severe. On Qulipta and feels that it has helped. Having 2-3 milder headaches a week.  Headaches are typically in the shoulders radiating up the neck to the back of the head.  Today she wants me to inject the trapezius muscle  03/15/22: more headaches this last month. Fortunately they have been mild.  Wondering if she should increase atenolol.  She is already had a good dose.  May consider adding on Qulipta in the future  09/06/21: Reports that migraines have decreased since she started Botox therapy.    BOTOX PROCEDURE NOTE FOR MIGRAINE HEADACHE    Contraindications and precautions discussed with patient(above). Aseptic procedure was observed and patient tolerated procedure. Procedure performed by Butch Penny, NP  The condition has existed for more than 6 months, and pt does not have a diagnosis of ALS, Myasthenia Gravis or Lambert-Eaton Syndrome.  Risks and benefits of injections discussed and pt agrees to proceed with the procedure.  Written consent obtained  These injections are medically necessary.  These injections do not cause sedations or hallucinations which the oral therapies may cause.  Indication/Diagnosis: chronic migraine BOTOX(J0585) injection was performed according to protocol by [facility]. 200 units of BOTOX was dissolved into 4 cc NS.   NDC: [address]-40  Botox- 200 units x 1 vial Lot: [id number] Expiration: 08/2025 NDC: 7829-5621-30   Bacteriostatic 0.9% Sodium Chloride- * mL  Lot: [id number] Expiration:  10/01/2023 NDC: 6962-9528-41   Dx: L24.401  Description of procedure:  The patient was placed in a sitting position. The standard protocol was used for Botox as follows, with 5 units of Botox injected at each site:   -Procerus muscle, midline injection  -Corrugator muscle, bilateral injection  -Frontalis muscle, bilateral injection, with 2 sites each side, medial injection was performed in the upper one third of the frontalis muscle, in the region vertical from the medial inferior edge of the superior orbital rim. The lateral injection was again in the upper one third of the forehead vertically above the lateral limbus of the cornea, 1.5 cm lateral to the medial injection site. 1 unit placed between slighty below two injections.   -Temporalis muscle injection, 4 sites, bilaterally. The first injection was 3 cm above the tragus of the ear, second injection site was 1.5 cm to 3 cm up from the first injection site in line with the tragus of the ear. The third injection site was 1.5-3 cm forward between the first 2 injection sites. The fourth injection site was 1.5 cm posterior to the second injection site.  -Occipitalis muscle injection, 3 sites, bilaterally. The first injection was done one half way between the occipital protuberance and the tip of the mastoid process behind the ear. The second injection site was done lateral and superior to the first, 1 fingerbreadth from the first injection. The third injection site was 1 fingerbreadth superiorly and medially from the first injection site.  -Cervical paraspinal muscle injection, 2 sites, bilateral  first injection site was 1 cm from the midline of the cervical  spine, 3 cm inferior to the lower border of the occipital protuberance. The second injection site was 1.5 cm superiorly and laterally to the first injection site.   -Trapezius muscle injection was performed at 3 sites, bilaterally. The first injection site was in the upper trapezius  muscle halfway between the inflection point of the neck, and the acromion. The second injection site was one half way between the acromion and the first injection site. The third injection was done between the first injection site and the inflection point of the neck.    Will return for repeat injection in 3 months.   A 200 units of Botox was used, 155 units were injected, the rest of the Botox was wasted. The patient tolerated the procedure well, there were no complications of the above procedure.  Butch Penny, MSN, NP-C 06/12/2023, 10:00 AM [facility] [address], [address] [place], [place] [address] [phone]

## 2023-07-06 ENCOUNTER — Other Ambulatory Visit: Payer: Self-pay | Admitting: Adult Health

## 2023-07-23 ENCOUNTER — Telehealth: Payer: Self-pay | Admitting: Adult Health

## 2023-07-23 NOTE — Telephone Encounter (Signed)
LVM and sent mychart msg informing pt of appt change on 09/12/23 - NP schedule change

## 2023-08-04 ENCOUNTER — Other Ambulatory Visit: Payer: Self-pay | Admitting: Adult Health

## 2023-08-18 ENCOUNTER — Encounter: Payer: Self-pay | Admitting: Adult Health

## 2023-09-09 ENCOUNTER — Other Ambulatory Visit: Payer: Self-pay | Admitting: Adult Health

## 2023-09-12 ENCOUNTER — Encounter: Payer: Self-pay | Admitting: Adult Health

## 2023-09-12 ENCOUNTER — Ambulatory Visit: Payer: 59 | Admitting: Adult Health

## 2023-09-12 DIAGNOSIS — G43709 Chronic migraine without aura, not intractable, without status migrainosus: Secondary | ICD-10-CM

## 2023-09-12 MED ORDER — ONABOTULINUMTOXINA 200 UNITS IJ SOLR
155.0000 [IU] | Freq: Once | INTRAMUSCULAR | Status: AC
  Administered 2023-09-12: 155 [IU] via INTRAMUSCULAR

## 2023-09-12 NOTE — Addendum Note (Signed)
 Addended by: Enedina Finner on: 09/12/2023 01:47 PM   Modules accepted: Level of Service

## 2023-09-12 NOTE — Progress Notes (Signed)
 Botox- 200 units x 1 vial Lot: [id number] Expiration: 09/2025 NDC: 1191-4782-95   Bacteriostatic 0.9% Sodium Chloride- 4 mL  Lot: [id number] Expiration: 05/02/2024 NDC: 6578-4696-29   Dx: B28.413 BB Witnessed by Deno Lunger CMA

## 2023-09-12 NOTE — Progress Notes (Signed)
 09/12/23: Botox, in combo with qulipta and PT has been helpful.   06/12/23: Botox continues to work well. No new issues.   03/05/23: Reports that Botox is working well.  She states that she typically gets more headaches a week before Botox is due.  Weather can also be a trigger for her.  She continues with dry needling and that has been helpful as well.  12/06/22: Headaches have been better. Started dry needling and that has been helpful. Has approximately 1 migraine a month.   09/12/21:  More frequent headaches but not as severe. On Qulipta and feels that it has helped. Having 2-3 milder headaches a week.  Headaches are typically in the shoulders radiating up the neck to the back of the head.  Today she wants me to inject the trapezius muscle  03/15/22: more headaches this last month. Fortunately they have been mild.  Wondering if she should increase atenolol.  She is already had a good dose.  May consider adding on Qulipta in the future  09/06/21: Reports that migraines have decreased since she started Botox therapy.    BOTOX PROCEDURE NOTE FOR MIGRAINE HEADACHE    Contraindications and precautions discussed with patient(above). Aseptic procedure was observed and patient tolerated procedure. Procedure performed by Butch Penny, NP  The condition has existed for more than 6 months, and pt does not have a diagnosis of ALS, Myasthenia Gravis or Lambert-Eaton Syndrome.  Risks and benefits of injections discussed and pt agrees to proceed with the procedure.  Written consent obtained  These injections are medically necessary.  These injections do not cause sedations or hallucinations which the oral therapies may cause.  Indication/Diagnosis: chronic migraine BOTOX(J0585) injection was performed according to protocol by [facility]. 200 units of BOTOX was dissolved into 4 cc NS.   NDC: [address]-40  Botox- 200 units x 1 vial Lot: [id number] Expiration: 09/2025 NDC: 9811-9147-82    Bacteriostatic 0.9% Sodium Chloride- 4 mL  Lot: [id number] Expiration: 05/02/2024 NDC: 0865-7846-96   Dx: E95.284   Description of procedure:  The patient was placed in a sitting position. The standard protocol was used for Botox as follows, with 5 units of Botox injected at each site:   -Procerus muscle, midline injection  -Corrugator muscle, bilateral injection  -Frontalis muscle, bilateral injection, with 2 sites each side, medial injection was performed in the upper one third of the frontalis muscle, in the region vertical from the medial inferior edge of the superior orbital rim. The lateral injection was again in the upper one third of the forehead vertically above the lateral limbus of the cornea, 1.5 cm lateral to the medial injection site.    -Temporalis muscle injection, 4 sites, bilaterally. The first injection was 3 cm above the tragus of the ear, second injection site was 1.5 cm to 3 cm up from the first injection site in line with the tragus of the ear. The third injection site was 1.5-3 cm forward between the first 2 injection sites. The fourth injection site was 1.5 cm posterior to the second injection site.  -Occipitalis muscle injection, 3 sites, bilaterally. The first injection was done one half way between the occipital protuberance and the tip of the mastoid process behind the ear. The second injection site was done lateral and superior to the first, 1 fingerbreadth from the first injection. The third injection site was 1 fingerbreadth superiorly and medially from the first injection site.  -Cervical paraspinal muscle injection, 2 sites, bilateral  first injection site  was 1 cm from the midline of the cervical spine, 3 cm inferior to the lower border of the occipital protuberance. The second injection site was 1.5 cm superiorly and laterally to the first injection site.   -Trapezius muscle injection was performed at 3 sites, bilaterally. The first injection site was in the  upper trapezius muscle halfway between the inflection point of the neck, and the acromion. The second injection site was one half way between the acromion and the first injection site. The third injection was done between the first injection site and the inflection point of the neck.    Will return for repeat injection in 3 months.   A 200 units of Botox was used, 155 units were injected, the rest of the Botox was wasted. The patient tolerated the procedure well, there were no complications of the above procedure.  Butch Penny, MSN, NP-C 09/12/2023, 1:17 PM [facility] [address] [place], [place] [address] [phone]

## 2023-09-19 ENCOUNTER — Encounter: Payer: Self-pay | Admitting: Adult Health

## 2023-09-25 NOTE — Telephone Encounter (Signed)
 I called pt and LVM (ok per DPR) asking for call back or mychart message to let us know how she wants [place] to get her form to her. We can email it to her email on file if she likes.   [email or web address]

## 2023-09-30 NOTE — Telephone Encounter (Signed)
 Emailed form to [email or web address]

## 2023-09-30 NOTE — Telephone Encounter (Signed)
 Faxed form to [place] [employer] at [phone]. Received a receipt of confirmation.

## 2023-10-07 ENCOUNTER — Other Ambulatory Visit: Payer: Self-pay | Admitting: Adult Health

## 2023-11-06 ENCOUNTER — Telehealth: Payer: Self-pay | Admitting: Adult Health

## 2023-11-06 NOTE — Telephone Encounter (Signed)
 Faxed [employer] auth along with OV notes, previous auth expired 11/07/23.

## 2023-11-15 ENCOUNTER — Other Ambulatory Visit: Payer: Self-pay | Admitting: Adult Health

## 2023-11-21 NOTE — Telephone Encounter (Signed)
 Received approval from [place], pt will continue to be B/B. Auth#: [id number] (11/21/23-11/19/24)

## 2023-11-22 ENCOUNTER — Encounter: Payer: Self-pay | Admitting: Adult Health

## 2023-12-04 NOTE — Progress Notes (Unsigned)
 12/04/23: 2-3 a month- usually right before cycle. Remains on qulipta .  On occasion with get an aura- floaters in the right periphery or halos in the periphery.   She was made aware of the increased stroke risk associated with migraine with aura and estrogen  09/12/23: Botox , in combo with qulipta  and PT has been helpful.   06/12/23: Botox  continues to work well. No new issues.   03/05/23: Reports that Botox  is working well.  She states that she typically gets more headaches a week before Botox  is due.  Weather can also be a trigger for her.  She continues with dry needling and that has been helpful as well.  12/06/22: Headaches have been better. Started dry needling and that has been helpful. Has approximately 1 migraine a month.   09/12/21:  More frequent headaches but not as severe. On Qulipta  and feels that it has helped. Having 2-3 milder headaches a week.  Headaches are typically in the shoulders radiating up the neck to the back of the head.  Today she wants me to inject the trapezius muscle  03/15/22: more headaches this last month. Fortunately they have been mild.  Wondering if she should increase atenolol .  She is already had a good dose.  May consider adding on Qulipta  in the future  09/06/21: Reports that migraines have decreased since she started Botox  therapy.    BOTOX  PROCEDURE NOTE FOR MIGRAINE HEADACHE    Contraindications and precautions discussed with patient(above). Aseptic procedure was observed and patient tolerated procedure. Procedure performed by Clem Currier, NP  The condition has existed for more than 6 months, and pt does not have a diagnosis of ALS, Myasthenia Gravis or Lambert-Eaton Syndrome.  Risks and benefits of injections discussed and pt agrees to proceed with the procedure.  Written consent obtained  These injections are medically necessary.  These injections do not cause sedations or hallucinations which the oral therapies may  cause.  Indication/Diagnosis: chronic migraine BOTOX (E9528) injection was performed according to protocol by [facility]. 200 units of BOTOX  was dissolved into 4 cc NS.   NDC: [address]-01  Botox - 200 units x 1 vial Lot: [id number] Expiration: 09/2025 NDC: 4132-4401-02   Bacteriostatic 0.9% Sodium Chloride - 4 mL  Lot: [id number] Expiration: 05/02/2024 NDC: 4403-4742-59   Dx: D63.875   Description of procedure:  The patient was placed in a sitting position. The standard protocol was used for Botox  as follows, with 5 units of Botox  injected at each site:   -Procerus muscle, midline injection  -Corrugator muscle, bilateral injection  -Frontalis muscle, bilateral injection, with 2 sites each side, medial injection was performed in the upper one third of the frontalis muscle, in the region vertical from the medial inferior edge of the superior orbital rim. The lateral injection was again in the upper one third of the forehead vertically above the lateral limbus of the cornea, 1.5 cm lateral to the medial injection site.    -Temporalis muscle injection, 4 sites, bilaterally. The first injection was 3 cm above the tragus of the ear, second injection site was 1.5 cm to 3 cm up from the first injection site in line with the tragus of the ear. The third injection site was 1.5-3 cm forward between the first 2 injection sites. The fourth injection site was 1.5 cm posterior to the second injection site.  -Occipitalis muscle injection, 3 sites, bilaterally. The first injection was done one half way between the occipital protuberance and the tip of the mastoid process behind the  ear. The second injection site was done lateral and superior to the first, 1 fingerbreadth from the first injection. The third injection site was 1 fingerbreadth superiorly and medially from the first injection site.  -Cervical paraspinal muscle injection, 2 sites, bilateral  first injection site was 1 cm from the midline of the  cervical spine, 3 cm inferior to the lower border of the occipital protuberance. The second injection site was 1.5 cm superiorly and laterally to the first injection site.   -Trapezius muscle injection was performed at 3 sites, bilaterally. The first injection site was in the upper trapezius muscle halfway between the inflection point of the neck, and the acromion. The second injection site was one half way between the acromion and the first injection site. The third injection was done between the first injection site and the inflection point of the neck.    Will return for repeat injection in 3 months.   A 200 units of Botox  was used, 155 units were injected, the rest of the Botox  was wasted. The patient tolerated the procedure well, there were no complications of the above procedure.  Clem Currier, MSN, NP-C 12/04/2023, 9:44 AM [facility] [address] [place], [place] [address] [phone]

## 2023-12-05 ENCOUNTER — Ambulatory Visit: Admitting: Adult Health

## 2023-12-05 VITALS — BP 133/85 | HR 111

## 2023-12-05 DIAGNOSIS — G43709 Chronic migraine without aura, not intractable, without status migrainosus: Secondary | ICD-10-CM

## 2023-12-05 DIAGNOSIS — G43109 Migraine with aura, not intractable, without status migrainosus: Secondary | ICD-10-CM | POA: Diagnosis not present

## 2023-12-05 MED ORDER — ONABOTULINUMTOXINA 200 UNITS IJ SOLR
155.0000 [IU] | Freq: Once | INTRAMUSCULAR | Status: AC
  Administered 2023-12-05: 155 [IU] via INTRAMUSCULAR

## 2023-12-05 NOTE — Patient Instructions (Signed)
 Krista Henson

## 2023-12-05 NOTE — Progress Notes (Signed)
 Botox - 200 units x 1 vial Lot: [id number] Expiration: 05/31/2026 NDC: 9604-5409-81  Bacteriostatic 0.9% Sodium Chloride - 4 mL  Lot: [id number] Expiration: 12/29/2024 NDC: 2956-2130-86  Dx: V78.469  B/B Witnessed by Vassie Gentry, RN

## 2024-02-27 ENCOUNTER — Ambulatory Visit: Admitting: Adult Health

## 2024-02-27 ENCOUNTER — Encounter: Payer: Self-pay | Admitting: Adult Health

## 2024-02-27 VITALS — BP 137/94 | HR 100

## 2024-02-27 DIAGNOSIS — G43709 Chronic migraine without aura, not intractable, without status migrainosus: Secondary | ICD-10-CM | POA: Diagnosis not present

## 2024-02-27 MED ORDER — ONABOTULINUMTOXINA 200 UNITS IJ SOLR
155.0000 [IU] | Freq: Once | INTRAMUSCULAR | Status: AC
  Administered 2024-02-27: 155 [IU] via INTRAMUSCULAR

## 2024-02-27 NOTE — Progress Notes (Signed)
 Botox - 200 units x 1 vial Lot: [id number] Expiration: 05/31/26 NDC: 9976-6078-97  Bacteriostatic 0.9% Sodium Chloride - 4 mL  Lot: [id number] Expiration: 05/01/25 NDC: 9590-8033-97  Dx: H56.290 B/B Witnessed by Heather BROCKS RN

## 2024-02-27 NOTE — Progress Notes (Signed)
 02/27/24:  Overall she feels that her migraines are doing well.  Still only having approximately 2-3 a month.  Headaches tend to increase at the end of the Botox  cycle.  She does get an aura with her headaches.  She reports that her PCP is decreasing her Effexor and increasing her Adderall.  She returns today for Botox .  12/04/23: 2-3 a month- usually right before cycle. Remains on qulipta .  On occasion with get an aura- floaters in the right periphery or halos in the periphery.   She was made aware of the increased stroke risk associated with migraine with aura and estrogen  09/12/23: Botox , in combo with qulipta  and PT has been helpful.   06/12/23: Botox  continues to work well. No new issues.   03/05/23: Reports that Botox  is working well.  She states that she typically gets more headaches a week before Botox  is due.  Weather can also be a trigger for her.  She continues with dry needling and that has been helpful as well.  12/06/22: Headaches have been better. Started dry needling and that has been helpful. Has approximately 1 migraine a month.   09/12/21:  More frequent headaches but not as severe. On Qulipta  and feels that it has helped. Having 2-3 milder headaches a week.  Headaches are typically in the shoulders radiating up the neck to the back of the head.  Today she wants me to inject the trapezius muscle  03/15/22: more headaches this last month. Fortunately they have been mild.  Wondering if she should increase atenolol .  She is already had a good dose.  May consider adding on Qulipta  in the future  09/06/21: Reports that migraines have decreased since she started Botox  therapy.    BOTOX  PROCEDURE NOTE FOR MIGRAINE HEADACHE    Contraindications and precautions discussed with patient(above). Aseptic procedure was observed and patient tolerated procedure. Procedure performed by Duwaine Russell, NP  The condition has existed for more than 6 months, and pt does not have a diagnosis of  ALS, Myasthenia Gravis or Lambert-Eaton Syndrome.  Risks and benefits of injections discussed and pt agrees to proceed with the procedure.  Written consent obtained  These injections are medically necessary.  These injections do not cause sedations or hallucinations which the oral therapies may cause.  Indication/Diagnosis: chronic migraine BOTOX (G9414) injection was performed according to protocol by [employer]. 200 units of BOTOX  was dissolved into 4 cc NS.   NDC: [address]-98  Botox - 200 units x 1 vial Lot: [id number] Expiration: 05/31/26 NDC: 9976-6078-97   Bacteriostatic 0.9% Sodium Chloride - 4 mL  Lot: [id number] Expiration: 05/01/25 NDC: 9590-8033-97   Dx: H56.290     Description of procedure:  The patient was placed in a sitting position. The standard protocol was used for Botox  as follows, with 5 units of Botox  injected at each site:   -Procerus muscle, midline injection  -Corrugator muscle, bilateral injection  -Frontalis muscle, bilateral injection, with 2 sites each side, medial injection was performed in the upper one third of the frontalis muscle, in the region vertical from the medial inferior edge of the superior orbital rim. The lateral injection was again in the upper one third of the forehead vertically above the lateral limbus of the cornea, 1.5 cm lateral to the medial injection site.    -Temporalis muscle injection, 4 sites, bilaterally. The first injection was 3 cm above the tragus of the ear, second injection site was 1.5 cm to 3 cm up from the first injection site  in line with the tragus of the ear. The third injection site was 1.5-3 cm forward between the first 2 injection sites. The fourth injection site was 1.5 cm posterior to the second injection site.  -Occipitalis muscle injection, 3 sites, bilaterally. The first injection was done one half way between the occipital protuberance and the tip of the mastoid process behind the ear. The second injection site was  done lateral and superior to the first, 1 fingerbreadth from the first injection. The third injection site was 1 fingerbreadth superiorly and medially from the first injection site.  -Cervical paraspinal muscle injection, 2 sites, bilateral  first injection site was 1 cm from the midline of the cervical spine, 3 cm inferior to the lower border of the occipital protuberance. The second injection site was 1.5 cm superiorly and laterally to the first injection site.   -Trapezius muscle injection was performed at 3 sites, bilaterally. The first injection site was in the upper trapezius muscle halfway between the inflection point of the neck, and the acromion. The second injection site was one half way between the acromion and the first injection site. The third injection was done between the first injection site and the inflection point of the neck.    Will return for repeat injection in 3 months.   A 200 units of Botox  was used, 155 units were injected, the rest of the Botox  was wasted. The patient tolerated the procedure well, there were no complications of the above procedure.  Duwaine Russell, MSN, NP-C 02/27/2024, 9:48 AM [facility] [address], [address] [place], [place] [address] [phone]

## 2024-05-25 NOTE — Progress Notes (Unsigned)
 02/27/24:  Overall she feels that her migraines are doing well.  Still only having approximately 2-3 a month.  Headaches tend to increase at the end of the Botox  cycle.  She does get an aura with her headaches.  She reports that her PCP is decreasing her Effexor and increasing her Adderall.  She returns today for Botox .  12/04/23: 2-3 a month- usually right before cycle. Remains on qulipta .  On occasion with get an aura- floaters in the right periphery or halos in the periphery.   She was made aware of the increased stroke risk associated with migraine with aura and estrogen  09/12/23: Botox , in combo with qulipta  and PT has been helpful.   06/12/23: Botox  continues to work well. No new issues.   03/05/23: Reports that Botox  is working well.  She states that she typically gets more headaches a week before Botox  is due.  Weather can also be a trigger for her.  She continues with dry needling and that has been helpful as well.  12/06/22: Headaches have been better. Started dry needling and that has been helpful. Has approximately 1 migraine a month.   09/12/21:  More frequent headaches but not as severe. On Qulipta  and feels that it has helped. Having 2-3 milder headaches a week.  Headaches are typically in the shoulders radiating up the neck to the back of the head.  Today she wants me to inject the trapezius muscle  03/15/22: more headaches this last month. Fortunately they have been mild.  Wondering if she should increase atenolol .  She is already had a good dose.  May consider adding on Qulipta  in the future  09/06/21: Reports that migraines have decreased since she started Botox  therapy.    BOTOX  PROCEDURE NOTE FOR MIGRAINE HEADACHE    Contraindications and precautions discussed with patient(above). Aseptic procedure was observed and patient tolerated procedure. Procedure performed by Duwaine Russell, NP  The condition has existed for more than 6 months, and pt does not have a diagnosis of  ALS, Myasthenia Gravis or Lambert-Eaton Syndrome.  Risks and benefits of injections discussed and pt agrees to proceed with the procedure.  Written consent obtained  These injections are medically necessary.  These injections do not cause sedations or hallucinations which the oral therapies may cause.  Indication/Diagnosis: chronic migraine BOTOX (G9414) injection was performed according to protocol by [employer]. 200 units of BOTOX  was dissolved into 4 cc NS.   NDC: [address]-98  Botox - 200 units x 1 vial Lot: [id number] Expiration: 05/31/26 NDC: 9976-6078-97   Bacteriostatic 0.9% Sodium Chloride - 4 mL  Lot: [id number] Expiration: 05/01/25 NDC: 9590-8033-97   Dx: H56.290     Description of procedure:  The patient was placed in a sitting position. The standard protocol was used for Botox  as follows, with 5 units of Botox  injected at each site:   -Procerus muscle, midline injection  -Corrugator muscle, bilateral injection  -Frontalis muscle, bilateral injection, with 2 sites each side, medial injection was performed in the upper one third of the frontalis muscle, in the region vertical from the medial inferior edge of the superior orbital rim. The lateral injection was again in the upper one third of the forehead vertically above the lateral limbus of the cornea, 1.5 cm lateral to the medial injection site.    -Temporalis muscle injection, 4 sites, bilaterally. The first injection was 3 cm above the tragus of the ear, second injection site was 1.5 cm to 3 cm up from the first injection site  in line with the tragus of the ear. The third injection site was 1.5-3 cm forward between the first 2 injection sites. The fourth injection site was 1.5 cm posterior to the second injection site.  -Occipitalis muscle injection, 3 sites, bilaterally. The first injection was done one half way between the occipital protuberance and the tip of the mastoid process behind the ear. The second injection site was  done lateral and superior to the first, 1 fingerbreadth from the first injection. The third injection site was 1 fingerbreadth superiorly and medially from the first injection site.  -Cervical paraspinal muscle injection, 2 sites, bilateral  first injection site was 1 cm from the midline of the cervical spine, 3 cm inferior to the lower border of the occipital protuberance. The second injection site was 1.5 cm superiorly and laterally to the first injection site.   -Trapezius muscle injection was performed at 3 sites, bilaterally. The first injection site was in the upper trapezius muscle halfway between the inflection point of the neck, and the acromion. The second injection site was one half way between the acromion and the first injection site. The third injection was done between the first injection site and the inflection point of the neck.    Will return for repeat injection in 3 months.   A 200 units of Botox  was used, 155 units were injected, the rest of the Botox  was wasted. The patient tolerated the procedure well, there were no complications of the above procedure.  Duwaine Russell, MSN, NP-C 05/25/2024, 4:30 PM [facility] [address] [place], [place] [address] [phone]

## 2024-05-26 ENCOUNTER — Ambulatory Visit: Admitting: Adult Health

## 2024-05-26 VITALS — BP 136/82 | HR 65

## 2024-05-26 DIAGNOSIS — G43709 Chronic migraine without aura, not intractable, without status migrainosus: Secondary | ICD-10-CM

## 2024-05-26 MED ORDER — ONABOTULINUMTOXINA 200 UNITS IJ SOLR
155.0000 [IU] | Freq: Once | INTRAMUSCULAR | Status: AC
  Administered 2024-05-26: 155 [IU] via INTRAMUSCULAR

## 2024-05-26 NOTE — Progress Notes (Signed)
 Botox - 200 units x 1 vial Lot: [id number] Expiration: 05/01/26 NDC: 9976-6078-97   Bacteriostatic 0.9% Sodium Chloride - 4 mL  Lot: [id number] Expiration: 05/01/25 NDC: 9590-8033-97   Dx: H56.290 B/B Witnessed by Rojean DEL

## 2024-06-10 ENCOUNTER — Ambulatory Visit: Admitting: Neurology

## 2024-06-30 ENCOUNTER — Encounter: Payer: Self-pay | Admitting: Adult Health

## 2024-06-30 DIAGNOSIS — G43709 Chronic migraine without aura, not intractable, without status migrainosus: Secondary | ICD-10-CM

## 2024-07-01 NOTE — Telephone Encounter (Signed)
 Please advise on medication concerns

## 2024-07-13 ENCOUNTER — Telehealth: Payer: Self-pay | Admitting: Neurology

## 2024-07-13 MED ORDER — QULIPTA 30 MG PO TABS: 30.0000 mg | ORAL_TABLET | Freq: Every day | ORAL | 2 refills | Status: AC

## 2024-07-13 NOTE — Telephone Encounter (Signed)
 Reduction of Quilipta from 60 mg to 30 mg for a month to see if side effects are truly from the medication and / or if dose dependent.   Dedra Gores, MD

## 2024-07-14 NOTE — Telephone Encounter (Signed)
 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 Message has been sent to Dr. Onita.

## 2024-07-14 NOTE — Telephone Encounter (Signed)
Please see the MyChart message reply(ies) for my assessment and plan.    This patient gave consent for this Medical Advice Message and is aware that it may result in a bill to [employer], as well as the possibility of receiving a bill for a co-payment or deductible. They are an established patient, but are not seeking medical advice exclusively about a problem treated during an in person or video visit in the last seven days. I did not recommend an in person or video visit within seven days of my reply.    I spent a total of 10 minutes cumulative time within 7 days through [employer].  Marcial Pacas, MD

## 2024-07-29 ENCOUNTER — Telehealth: Payer: Self-pay | Admitting: Adult Health

## 2024-07-29 NOTE — Telephone Encounter (Signed)
 Submitted PA request to [employer] to switch pt to SP through [facility], status is pending.

## 2024-08-25 ENCOUNTER — Ambulatory Visit: Admitting: Adult Health

## 2024-10-12 ENCOUNTER — Ambulatory Visit: Admitting: Neurology
